# Patient Record
Sex: Female | Born: 1965 | Race: White | Hispanic: No | Marital: Married | Smoking: Current every day smoker
Health system: Southern US, Community
[De-identification: ages and names within clinical notes are randomized; demographics above are authoritative.]

## PROBLEM LIST (undated history)

## (undated) DIAGNOSIS — J439 Emphysema, unspecified: Secondary | ICD-10-CM

## (undated) DIAGNOSIS — M7989 Other specified soft tissue disorders: Secondary | ICD-10-CM

## (undated) DIAGNOSIS — K829 Disease of gallbladder, unspecified: Secondary | ICD-10-CM

## (undated) DIAGNOSIS — M2550 Pain in unspecified joint: Secondary | ICD-10-CM

## (undated) DIAGNOSIS — R0602 Shortness of breath: Secondary | ICD-10-CM

## (undated) DIAGNOSIS — I700 Atherosclerosis of aorta: Secondary | ICD-10-CM

## (undated) DIAGNOSIS — R3915 Urgency of urination: Secondary | ICD-10-CM

## (undated) DIAGNOSIS — F419 Anxiety disorder, unspecified: Secondary | ICD-10-CM

## (undated) DIAGNOSIS — E739 Lactose intolerance, unspecified: Secondary | ICD-10-CM

## (undated) DIAGNOSIS — K529 Noninfective gastroenteritis and colitis, unspecified: Secondary | ICD-10-CM

## (undated) DIAGNOSIS — M1990 Unspecified osteoarthritis, unspecified site: Secondary | ICD-10-CM

## (undated) DIAGNOSIS — G4730 Sleep apnea, unspecified: Secondary | ICD-10-CM

## (undated) DIAGNOSIS — M792 Neuralgia and neuritis, unspecified: Secondary | ICD-10-CM

## (undated) DIAGNOSIS — I10 Essential (primary) hypertension: Secondary | ICD-10-CM

## (undated) DIAGNOSIS — I739 Peripheral vascular disease, unspecified: Secondary | ICD-10-CM

## (undated) DIAGNOSIS — E119 Type 2 diabetes mellitus without complications: Secondary | ICD-10-CM

## (undated) DIAGNOSIS — R001 Bradycardia, unspecified: Secondary | ICD-10-CM

## (undated) DIAGNOSIS — E7800 Pure hypercholesterolemia, unspecified: Secondary | ICD-10-CM

## (undated) DIAGNOSIS — Z9989 Dependence on other enabling machines and devices: Secondary | ICD-10-CM

## (undated) DIAGNOSIS — Z973 Presence of spectacles and contact lenses: Secondary | ICD-10-CM

## (undated) DIAGNOSIS — K219 Gastro-esophageal reflux disease without esophagitis: Secondary | ICD-10-CM

## (undated) DIAGNOSIS — M47816 Spondylosis without myelopathy or radiculopathy, lumbar region: Secondary | ICD-10-CM

## (undated) DIAGNOSIS — J449 Chronic obstructive pulmonary disease, unspecified: Secondary | ICD-10-CM

## (undated) DIAGNOSIS — R350 Frequency of micturition: Secondary | ICD-10-CM

## (undated) DIAGNOSIS — F32A Depression, unspecified: Secondary | ICD-10-CM

## (undated) DIAGNOSIS — R351 Nocturia: Secondary | ICD-10-CM

## (undated) DIAGNOSIS — E039 Hypothyroidism, unspecified: Secondary | ICD-10-CM

## (undated) DIAGNOSIS — G4733 Obstructive sleep apnea (adult) (pediatric): Secondary | ICD-10-CM

## (undated) DIAGNOSIS — M549 Dorsalgia, unspecified: Secondary | ICD-10-CM

## (undated) DIAGNOSIS — E559 Vitamin D deficiency, unspecified: Secondary | ICD-10-CM

## (undated) HISTORY — DX: Vitamin D deficiency, unspecified: E55.9

## (undated) HISTORY — DX: Lactose intolerance, unspecified: E73.9

## (undated) HISTORY — DX: Disease of gallbladder, unspecified: K82.9

## (undated) HISTORY — DX: Dorsalgia, unspecified: M54.9

## (undated) HISTORY — DX: Sleep apnea, unspecified: G47.30

## (undated) HISTORY — DX: Pain in unspecified joint: M25.50

## (undated) HISTORY — DX: Shortness of breath: R06.02

## (undated) HISTORY — DX: Noninfective gastroenteritis and colitis, unspecified: K52.9

## (undated) HISTORY — DX: Anxiety disorder, unspecified: F41.9

## (undated) HISTORY — DX: Pure hypercholesterolemia, unspecified: E78.00

## (undated) HISTORY — DX: Bradycardia, unspecified: R00.1

## (undated) HISTORY — DX: Morbid (severe) obesity due to excess calories: E66.01

## (undated) HISTORY — DX: Peripheral vascular disease, unspecified: I73.9

## (undated) HISTORY — DX: Depression, unspecified: F32.A

## (undated) HISTORY — DX: Atherosclerosis of aorta: I70.0

## (undated) HISTORY — DX: Emphysema, unspecified: J43.9

## (undated) HISTORY — DX: Other specified soft tissue disorders: M79.89

## (undated) HISTORY — PX: COLONOSCOPY: SHX174

## (undated) HISTORY — DX: Unspecified osteoarthritis, unspecified site: M19.90

## (undated) HISTORY — DX: Hypothyroidism, unspecified: E03.9

---

## 1990-05-25 HISTORY — PX: LAPAROSCOPIC CHOLECYSTECTOMY: SUR755

## 1993-05-25 HISTORY — PX: TUBAL LIGATION: SHX77

## 1998-04-16 ENCOUNTER — Encounter: Admission: RE | Admit: 1998-04-16 | Discharge: 1998-04-16 | Payer: Self-pay | Admitting: *Deleted

## 2009-09-04 ENCOUNTER — Emergency Department (HOSPITAL_COMMUNITY): Admission: EM | Admit: 2009-09-04 | Discharge: 2009-09-04 | Payer: Self-pay | Admitting: Emergency Medicine

## 2016-09-29 ENCOUNTER — Encounter: Payer: Self-pay | Admitting: *Deleted

## 2016-09-30 ENCOUNTER — Ambulatory Visit (INDEPENDENT_AMBULATORY_CARE_PROVIDER_SITE_OTHER): Payer: BLUE CROSS/BLUE SHIELD | Admitting: Cardiovascular Disease

## 2016-09-30 ENCOUNTER — Encounter: Payer: Self-pay | Admitting: Cardiovascular Disease

## 2016-09-30 VITALS — BP 122/78 | HR 58 | Ht 66.0 in | Wt 264.0 lb

## 2016-09-30 DIAGNOSIS — I517 Cardiomegaly: Secondary | ICD-10-CM | POA: Diagnosis not present

## 2016-09-30 DIAGNOSIS — Z01818 Encounter for other preprocedural examination: Secondary | ICD-10-CM

## 2016-09-30 NOTE — Patient Instructions (Signed)
Medication Instructions:  Continue all current medications.  Labwork: none  Testing/Procedures:  Your physician has requested that you have an echocardiogram. Echocardiography is a painless test that uses sound waves to create images of your heart. It provides your doctor with information about the size and shape of your heart and how well your heart's chambers and valves are working. This procedure takes approximately one hour. There are no restrictions for this procedure.  Office will contact with results via phone or letter.    Follow-Up: To be determined.    Any Other Special Instructions Will Be Listed Below (If Applicable).  If you need a refill on your cardiac medications before your next appointment, please call your pharmacy.  

## 2016-09-30 NOTE — Progress Notes (Signed)
CARDIOLOGY CONSULT NOTE  Patient ID: Christy Randall MRN: [id number] DOB/AGE: [birth year] 51 y.o.  Admit date: (Not on file) Primary Physician: Monico Blitz, MD Referring Physician: Elsie Saas MD  Reason for Consultation: preop clearance, LVH  HPI: Christy Randall is a 51 y.o. female who is being seen today for the evaluation of preoperative risk stratification and LVH at the request of Elsie Saas, MD. Christy Randall needs left total Randall replacement surgery. She has a history of hypothyroidism. There is some mention in documentation regarding "questionable LVH".  ECG performed in the office today which I ordered and personally interpreted demonstrates sinus bradycardia with mild nonspecific T-wave abnormalities. There is no evidence of LVH by ECG criteria.  I do not find any documentation regarding stress testing or echocardiograms.  It appears about 20 years ago she had been on a diet medication. She went to [facility] and got an echocardiogram on her own which reportedly demonstrated "LVH ". She never saw a cardiologist until today. Her daughter who is an Therapist, sports mentioned this at her appointment with her orthopedic surgeon.  She denies exertional chest pain. She said she is much less short of breath after having quit smoking on 05/24/2016. She smoked from one to one and a half packs of cigarettes daily for 37 years. She denies palpitations. She denies ankle swelling, orthopnea, and paroxysmal nocturnal dyspnea.  She has sleep apnea and started CPAP this week area she said she was supposed to start CPAP sometime back.     Allergies  Allergen Reactions  . Morphine And Related     Current Outpatient Prescriptions  Medication Sig Dispense Refill  . diazepam (VALIUM) 2 MG tablet Take 2 mg by mouth every 6 (six) hours as needed for anxiety.    Marland Kitchen levothyroxine (SYNTHROID, LEVOTHROID) 125 MCG tablet Take 125 mcg by mouth daily before breakfast.    . meloxicam (MOBIC) 7.5 MG  tablet Take 15 mg by mouth daily.     No current facility-administered medications for this visit.     Past Medical History:  Diagnosis Date  . Anxiety   . Hypothyroid   . Osteoarthritis    left Randall  . Sleep apnea     Past Surgical History:  Procedure Laterality Date  . CESAREAN SECTION    . CHOLECYSTECTOMY    . TUBAL LIGATION      Social History   Social History  . Marital status: Divorced    Spouse name: N/A  . Number of children: N/A  . Years of education: N/A   Occupational History  . Not on file.   Social History Main Topics  . Smoking status: Former Smoker    Packs/day: 1.00    Types: Cigarettes    Start date: 11/19/1986    Quit date: 05/24/2016  . Smokeless tobacco: Never Used  . Alcohol use Not on file  . Drug use: Unknown  . Sexual activity: Not on file   Other Topics Concern  . Not on file   Social History Narrative  . No narrative on file     No family history of premature CAD in 1st degree relatives.  Current Meds  Medication Sig  . diazepam (VALIUM) 2 MG tablet Take 2 mg by mouth every 6 (six) hours as needed for anxiety.  Marland Kitchen levothyroxine (SYNTHROID, LEVOTHROID) 125 MCG tablet Take 125 mcg by mouth daily before breakfast.  . meloxicam (MOBIC) 7.5 MG tablet Take 15 mg by mouth daily.  . [  DISCONTINUED] levothyroxine (SYNTHROID, LEVOTHROID) 150 MCG tablet Take 150 mcg by mouth daily before breakfast.      Review of systems complete and found to be negative unless listed above in HPI    Physical exam Blood pressure 122/78, pulse (!) 58, height 5\' 6"  (1.676 m), weight 264 lb (119.7 kg), SpO2 98 %. General: NAD Neck: No JVD, no thyromegaly or thyroid nodule.  Lungs: Clear to auscultation bilaterally with normal respiratory effort. CV: Nondisplaced PMI. Regular rate and rhythm, normal S1/S2, no S3/S4, no murmur.  No peripheral edema.  No carotid bruit.    Abdomen: Soft, nontender, obese. Skin: Intact without lesions or rashes.    Neurologic: Alert and oriented x 3.  Psych: Normal affect. Extremities: No clubbing or cyanosis.  HEENT: Normal.   ECG: Most recent ECG reviewed.   Labs: No results found for: K, BUN, CREATININE, ALT, TSH, HGB   Lipids: No results found for: LDLCALC, LDLDIRECT, CHOL, TRIG, HDL      ASSESSMENT AND PLAN:  1. Preoperative risk stratification: She denies exertional chest pain. Cardiac risk factors include a prior history of tobacco abuse and a current history of sleep apnea. I will order a 2-D echocardiogram with Doppler to evaluate cardiac structure, function, and regional wall motion. Given the absence of symptoms, she is likely at a low perioperative cardiac risk for planned surgery.  2. Left ventricular hypertrophy: No evidence of LVH on ECG. No history of hypertension. She does have a long history of tobacco abuse and has sleep apnea for which she recently started treatment. I will order a 2-D echocardiogram with Doppler to evaluate cardiac structure, function, and regional wall motion.   Disposition: Follow up to be determined.  Signed: Kate Sable, M.D., F.A.C.C.  09/30/2016, 3:01 PM

## 2016-10-01 ENCOUNTER — Ambulatory Visit (HOSPITAL_COMMUNITY)
Admission: RE | Admit: 2016-10-01 | Discharge: 2016-10-01 | Disposition: A | Discharge status: Home / Self Care | Payer: BLUE CROSS/BLUE SHIELD | Source: Ambulatory Visit | Attending: Cardiovascular Disease | Admitting: Cardiovascular Disease

## 2016-10-01 ENCOUNTER — Telehealth: Payer: Self-pay | Admitting: *Deleted

## 2016-10-01 DIAGNOSIS — I517 Cardiomegaly: Secondary | ICD-10-CM | POA: Diagnosis not present

## 2016-10-01 HISTORY — PX: TRANSTHORACIC ECHOCARDIOGRAM: SHX275

## 2016-10-01 NOTE — Progress Notes (Signed)
*  PRELIMINARY RESULTS* Echocardiogram 2D Echocardiogram has been performed.  Christy Randall 10/01/2016, 9:26 AM

## 2016-10-01 NOTE — Telephone Encounter (Signed)
Notes recorded by Laurine Blazer, LPN on 1/83/4373 at 5:78 PM EDT Patient notified. Copy to pmd & to [employer]. Clearance form faxed back today. ------  Notes recorded by Herminio Commons, MD on 10/01/2016 at 3:27 PM EDT Vigorous pumping function.

## 2016-10-20 NOTE — Pre-Procedure Instructions (Signed)
Christy Randall  10/20/2016      [facility] [address], [place] [address] Phone: [phone] Fax: [phone]   Your procedure is scheduled on November 02, 2016. Report to [facility] Admitting at 530 AM.  Call this number if you have problems the morning of surgery:[phone]   Remember:  Do not eat food or drink liquids after midnight.  Take these medicines the morning of surgery with A SIP OF WATER diazepam (valium)-if needed for anxiety, hydrocodone-acetaminophen (norco)-if needed for pain, levothyroxine (synthroid).  7 days prior to surgery STOP taking any Meloxicam (mobic), Aspirin, Aleve, Naproxen, Ibuprofen, Motrin, Advil, Goody's, BC's, all herbal medications, fish oil, and all vitamins   Do not wear jewelry, make-up or nail polish.  Do not wear lotions, powders, or perfumes, or deoderant.  Do not shave 48 hours prior to surgery.    Do not bring valuables to the hospital.  [facility] is not responsible for any belongings or valuables.  Contacts, dentures or bridgework may not be worn into surgery.  Leave your suitcase in the car.  After surgery it may be brought to your room.  For patients admitted to the hospital, discharge time will be determined by your treatment team.  Patients discharged the day of surgery will not be allowed to drive home.    Special instructions:   [facility]- Preparing For Surgery  Before surgery, you can play an important role. Because skin is not sterile, your skin needs to be as free of germs as possible. You can reduce the number of germs on your skin by washing with CHG (chlorahexidine gluconate) Soap before surgery.  CHG is an antiseptic cleaner which kills germs and bonds with the skin to continue killing germs even after washing.  Please do not use if you have an allergy to CHG or antibacterial soaps. If your skin becomes reddened/irritated stop using the CHG.  Do not shave  (including legs and underarms) for at least 48 hours prior to first CHG shower. It is OK to shave your face.  Please follow these instructions carefully.   1. Shower the NIGHT BEFORE SURGERY and the MORNING OF SURGERY with CHG.   2. If you chose to wash your hair, wash your hair first as usual with your normal shampoo.  3. After you shampoo, rinse your hair and body thoroughly to remove the shampoo.  4. Use CHG as you would any other liquid soap. You can apply CHG directly to the skin and wash gently with a scrungie or a clean washcloth.   5. Apply the CHG Soap to your body ONLY FROM THE NECK DOWN.  Do not use on open wounds or open sores. Avoid contact with your eyes, ears, mouth and genitals (private parts). Wash genitals (private parts) with your normal soap.  6. Wash thoroughly, paying special attention to the area where your surgery will be performed.  7. Thoroughly rinse your body with warm water from the neck down.  8. DO NOT shower/wash with your normal soap after using and rinsing off the CHG Soap.  9. Pat yourself dry with a CLEAN TOWEL.   10. Wear CLEAN PAJAMAS   11. Place CLEAN SHEETS on your bed the night of your first shower and DO NOT SLEEP WITH PETS.    Day of Surgery: Do not apply any deodorants/lotions. Please wear clean clothes to the hospital/surgery center.  Please read over the following fact sheets that you were given. Pain Booklet, Coughing and Deep Breathing, MRSA Information and Surgical Site Infection Prevention

## 2016-10-21 ENCOUNTER — Encounter (HOSPITAL_COMMUNITY)
Admission: RE | Admit: 2016-10-21 | Discharge: 2016-10-21 | Disposition: A | Discharge status: Home / Self Care | Payer: BLUE CROSS/BLUE SHIELD | Source: Ambulatory Visit | Attending: Orthopedic Surgery | Admitting: Orthopedic Surgery

## 2016-10-21 ENCOUNTER — Encounter (HOSPITAL_COMMUNITY): Payer: Self-pay | Admitting: Physician Assistant

## 2016-10-21 ENCOUNTER — Encounter (HOSPITAL_COMMUNITY): Payer: Self-pay | Admitting: *Deleted

## 2016-10-21 DIAGNOSIS — Z01818 Encounter for other preprocedural examination: Secondary | ICD-10-CM | POA: Diagnosis present

## 2016-10-21 DIAGNOSIS — E039 Hypothyroidism, unspecified: Secondary | ICD-10-CM | POA: Diagnosis present

## 2016-10-21 DIAGNOSIS — M1712 Unilateral primary osteoarthritis, left knee: Secondary | ICD-10-CM | POA: Insufficient documentation

## 2016-10-21 DIAGNOSIS — G4730 Sleep apnea, unspecified: Secondary | ICD-10-CM | POA: Diagnosis present

## 2016-10-21 DIAGNOSIS — J449 Chronic obstructive pulmonary disease, unspecified: Secondary | ICD-10-CM | POA: Diagnosis present

## 2016-10-21 DIAGNOSIS — K219 Gastro-esophageal reflux disease without esophagitis: Secondary | ICD-10-CM | POA: Diagnosis present

## 2016-10-21 DIAGNOSIS — F419 Anxiety disorder, unspecified: Secondary | ICD-10-CM | POA: Diagnosis present

## 2016-10-21 HISTORY — DX: Chronic obstructive pulmonary disease, unspecified: J44.9

## 2016-10-21 HISTORY — DX: Gastro-esophageal reflux disease without esophagitis: K21.9

## 2016-10-21 LAB — BASIC METABOLIC PANEL
Anion gap: 10 (ref 5–15)
BUN: 12 mg/dL (ref 6–20)
CALCIUM: 9 mg/dL (ref 8.9–10.3)
CO2: 26 mmol/L (ref 22–32)
Chloride: 103 mmol/L (ref 101–111)
Creatinine, Ser: 0.92 mg/dL (ref 0.44–1.00)
GFR calc Af Amer: >60 mL/min (ref >60)
GLUCOSE: 180 mg/dL — AB (ref 65–99)
POTASSIUM: 3.6 mmol/L (ref 3.5–5.1)
Sodium: 139 mmol/L (ref 135–145)

## 2016-10-21 LAB — CBC
HEMATOCRIT: 42.2 % (ref 36.0–46.0)
HEMOGLOBIN: 13.8 g/dL (ref 12.0–15.0)
MCH: 31.1 pg (ref 26.0–34.0)
MCHC: 32.7 g/dL (ref 30.0–36.0)
MCV: 95 fL (ref 78.0–100.0)
Platelets: 269 10*3/uL (ref 150–400)
RBC: 4.44 MIL/uL (ref 3.87–5.11)
RDW: 14.1 % (ref 11.5–15.5)
WBC: 10.2 10*3/uL (ref 4.0–10.5)

## 2016-10-21 LAB — SURGICAL PCR SCREEN
MRSA, PCR: NEGATIVE
STAPHYLOCOCCUS AUREUS: POSITIVE — AB

## 2016-10-21 NOTE — Progress Notes (Signed)
Prescription called in at [facility] in [place], patient verbalized understanding

## 2016-10-21 NOTE — Progress Notes (Signed)
PCP - Monico Blitz Cardiologist - denies  Chest x-ray - not needed EKG - 09/30/16 Stress Test - denies ECHO - 10/01/16 Cardiac Cath - denies  Sleep Study - home sleep study a "few Years ago" CPAP - wears at night - instructed patient to bring mask and tubing with her the morning of surgery    Sending to anesthesia for review of sleep apnea   Patient denies shortness of breath, fever, cough and chest pain at PAT appointment   Patient verbalized understanding of instructions that were given to them at the PAT appointment. Patient was also instructed that they will need to review over the PAT instructions again at home before surgery.

## 2016-10-21 NOTE — H&P (Signed)
TOTAL KNEE ADMISSION H&P  Patient is being admitted for left total knee arthroplasty.  Subjective:  Chief Complaint:left knee pain.  HPI: Christy Randall, 51 y.o. female, has a history of pain and functional disability in the left knee due to arthritis and has failed non-surgical conservative treatments for greater than 12 weeks to includeNSAID's and/or analgesics, corticosteriod injections, viscosupplementation injections, flexibility and strengthening excercises, supervised PT with diminished ADL's post treatment, weight reduction as appropriate and activity modification.  Onset of symptoms was gradual, starting 10 years ago with gradually worsening course since that time. The patient noted no past surgery on the left knee(s).  Patient currently rates pain in the left knee(s) at 10 out of 10 with activity. Patient has night pain, worsening of pain with activity and weight bearing, pain that interferes with activities of daily living, crepitus and joint swelling.  Patient has evidence of subchondral sclerosis, periarticular osteophytes and joint space narrowing by imaging studies.There is no active infection.  Patient Active Problem List   Diagnosis Date Noted  . Primary localized osteoarthrosis of the knee, left   . Sleep apnea uses CPAP   . Hypothyroid   . GERD (gastroesophageal reflux disease)   . COPD (chronic obstructive pulmonary disease) ([place])   . Anxiety    Past Medical History:  Diagnosis Date  . Anxiety   . COPD (chronic obstructive pulmonary disease) ([place])   . GERD (gastroesophageal reflux disease)   . Hypothyroid   . Osteoarthritis    left knee  . Primary localized osteoarthrosis of the knee, left   . Sleep apnea uses CPAP     Past Surgical History:  Procedure Laterality Date  . CESAREAN SECTION    . CHOLECYSTECTOMY    . COLONOSCOPY    . TUBAL LIGATION      No current facility-administered medications for this encounter.   Current Outpatient Prescriptions:  .   Cholecalciferol (VITAMIN D3) 2000 units TABS, Take 2,000 Units by mouth daily., Disp: , Rfl:  .  diazepam (VALIUM) 2 MG tablet, Take 2 mg by mouth every 6 (six) hours as needed for anxiety., Disp: , Rfl:  .  HYDROcodone-acetaminophen (NORCO/VICODIN) 5-325 MG tablet, Take 1 tablet by mouth every 8 (eight) hours as needed (for pain.). (TYPICALLY AT BEDTIME ONLY), Disp: , Rfl:  .  levothyroxine (SYNTHROID, LEVOTHROID) 125 MCG tablet, Take 125 mcg by mouth daily before breakfast., Disp: , Rfl:  .  predniSONE (DELTASONE) 5 MG tablet, Take 5 mg by mouth daily with breakfast., Disp: , Rfl:  .  meloxicam (MOBIC) 7.5 MG tablet, Take 15 mg by mouth daily., Disp: , Rfl:  Allergies  Allergen Reactions  . Morphine And Related Other (See Comments)    "Felt like she was going to die & felt like her blood was on fire"    Social History  Substance Use Topics  . Smoking status: Former Smoker    Packs/day: 1.00    Years: 37.00    Types: Cigarettes    Start date: 11/19/1986    Quit date: 05/24/2016  . Smokeless tobacco: Never Used  . Alcohol use No    Family History  Problem Relation Age of Onset  . Diabetes Mother      Review of Systems  Constitutional: Negative.   HENT: Negative.   Eyes: Negative.   Respiratory: Negative.   Cardiovascular: Negative.   Gastrointestinal: Negative.   Genitourinary: Negative.   Musculoskeletal: Positive for back pain and joint pain.  Skin: Negative.   Neurological:  Negative.   Endo/Heme/Allergies: Negative.   Psychiatric/Behavioral: Negative.     Objective:  Physical Exam  Constitutional: She is oriented to person, place, and time. She appears well-developed and well-nourished.  HENT:  Head: Normocephalic and atraumatic.  Mouth/Throat: Oropharynx is clear and moist.  Eyes: Conjunctivae are normal. Pupils are equal, round, and reactive to light.  Neck: Neck supple.  Cardiovascular: Normal rate and regular rhythm.   Respiratory: Effort normal and breath  sounds normal.  GI: Soft. Bowel sounds are normal.  Genitourinary:  Genitourinary Comments: Not pertinent to current symptomatology therefore not examined.  Musculoskeletal:  Examination of her left knee reveals pain medially and laterally.  1+ crepitation.  1+ synovitis.  Range of motion is from 0-120 degrees.  Knee is stable with normal patella tracking.  Examination of her right knee reveals full range of motion without pain, swelling, weakness or instability.  Vascular exam: Pulses are 2+ and symmetric.  Neurologic exam: Distal motor and sensory examination is within normal limits.  Neurological: She is alert and oriented to person, place, and time.  Skin: Skin is warm and dry.  Psychiatric: She has a normal mood and affect. Her behavior is normal.    Vital signs in last 24 hours: Temp:  [97.9 F (36.6 C)-98.2 F (36.8 C)] 97.9 F (36.6 C) (05/30 1400) Pulse Rate:  [59-68] 68 (05/30 1400) Resp:  [20] 20 (05/30 0911) BP: (132-154)/(70-80) 132/70 (05/30 1400) SpO2:  [96 %-98 %] 98 % (05/30 1400) Weight:  [122.5 kg (270 lb)-122.7 kg (270 lb 9.6 oz)] 122.5 kg (270 lb) (05/30 1400)  Labs:   Estimated body mass index is 44.93 kg/m as calculated from the following:   Height as of this encounter: 5\' 5"  (1.651 m).   Weight as of this encounter: 122.5 kg (270 lb).   Imaging Review Plain radiographs demonstrate severe degenerative joint disease of the left knee(s). The overall alignment issignificant varus. The bone quality appears to be good for age and reported activity level.  Assessment/Plan:  End stage arthritis, left knee Active Problems:   Primary localized osteoarthrosis of the knee, left   Sleep apnea uses CPAP   Hypothyroid   GERD (gastroesophageal reflux disease)   COPD (chronic obstructive pulmonary disease) (H[facility])   Anxiety    The patient history, physical examination, clinical judgment of the provider and imaging studies are consistent with end stage degenerative  joint disease of the left knee(s) and total knee arthroplasty is deemed medically necessary. The treatment options including medical management, injection therapy arthroscopy and arthroplasty were discussed at length. The risks and benefits of total knee arthroplasty were presented and reviewed. The risks due to aseptic loosening, infection, stiffness, patella tracking problems, thromboembolic complications and other imponderables were discussed. The patient acknowledged the explanation, agreed to proceed with the plan and consent was signed. Patient is being admitted for inpatient treatment for surgery, pain control, PT, OT, prophylactic antibiotics, VTE prophylaxis, progressive ambulation and ADL's and discharge planning. The patient is planning to be discharged home with home health services with husband and Daughter MRSA positive

## 2016-10-22 NOTE — Progress Notes (Signed)
Anesthesia Chart Review:  Pt is a 51 year old female scheduled for L total knee arthroplasty on 11/02/2016 with Elsie Saas, M.D.  - PCP is Monico Blitz, MD - Saw cardiologist Kate Sable, MD for pre-op eval 09/30/16.  Echo ordered, results below. Pt cleared for surgery.   PMH includes: COPD, OSA, hypothyroidism, GERD. Former smoker. BMI 45.  Medications include: Levothyroxine, prednisone  Preoperative labs reviewed.  - Glucose 180. Pt does not have history of DM. Will route results to PCP for follow-up.   EKG 09/30/16: Sinus  Bradycardia (56 bpm). RSR(V1) -nondiagnostic. Nonspecific T-abnormality.   Echo 10/01/16:  - Left ventricle: The cavity size was normal. Wall thickness was increased in a pattern of mild LVH. Systolic function was vigorous. The estimated ejection fraction was in the range of 65% to 70%. Wall motion was normal; there were no regional wall motion abnormalities. Doppler parameters are consistent with abnormal left ventricular relaxation (grade 1 diastolic dysfunction). - Mitral valve: There was trivial regurgitation. - Right atrium: Central venous pressure (est): 3 mm Hg. - Tricuspid valve: There was trivial regurgitation. - Pulmonary arteries: Systolic pressure could not be accurately estimated. - Pericardium, extracardiac: A prominent pericardial fat pad was present. - Impressions: Mild LVH with LVEF 65-70% and grade 1 diastolic dysfunction. Trivial mitral regurgitation. Trivial tricuspid regurgitation.  If no changes, I anticipate pt can proceed with surgery as scheduled.   Willeen Cass, FNP-BC [facility]/Anesthesiology Phone: [phone] 10/22/2016 1:18 PM

## 2016-10-23 ENCOUNTER — Other Ambulatory Visit (HOSPITAL_COMMUNITY): Payer: BLUE CROSS/BLUE SHIELD

## 2016-10-30 MED ORDER — TRANEXAMIC ACID 1000 MG/10ML IV SOLN
1000.0000 mg | INTRAVENOUS | Status: AC
  Administered 2016-11-02: 1000 mg via INTRAVENOUS
  Filled 2016-10-30: qty 10

## 2016-10-30 MED ORDER — VANCOMYCIN HCL 10 G IV SOLR
1500.0000 mg | INTRAVENOUS | Status: AC
  Administered 2016-11-02: 1500 mg via INTRAVENOUS
  Filled 2016-10-30 (×2): qty 1500

## 2016-10-30 MED ORDER — LACTATED RINGERS IV SOLN
INTRAVENOUS | Status: DC
Start: 2016-11-02 — End: 2016-11-02

## 2016-11-01 NOTE — Anesthesia Preprocedure Evaluation (Addendum)
Anesthesia Evaluation  Patient identified by MRN, date of birth, ID band Patient awake    Reviewed: Allergy & Precautions, H&P , NPO status , Patient's Chart, lab work & pertinent test results  Airway Mallampati: III  TM Distance: >3 FB Neck ROM: Full    Dental no notable dental hx. (+) Teeth Intact, Dental Advisory Given   Pulmonary sleep apnea and Continuous Positive Airway Pressure Ventilation , former smoker,    Pulmonary exam normal breath sounds clear to auscultation       Cardiovascular Exercise Tolerance: Good negative cardio ROS   Rhythm:Regular Rate:Normal     Neuro/Psych Anxiety negative neurological ROS  negative psych ROS   GI/Hepatic Neg liver ROS, GERD  Controlled,  Endo/Other  Hypothyroidism Morbid obesity  Renal/GU negative Renal ROS  negative genitourinary   Musculoskeletal  (+) Arthritis , Osteoarthritis,    Abdominal   Peds  Hematology negative hematology ROS (+)   Anesthesia Other Findings   Reproductive/Obstetrics negative OB ROS                            Anesthesia Physical Anesthesia Plan  ASA: III  Anesthesia Plan: Spinal   Post-op Pain Management:  Regional for Post-op pain   Induction: Intravenous  PONV Risk Score and Plan: 3 and Ondansetron, Dexamethasone, Propofol and Midazolam  Airway Management Planned: Simple Face Mask  Additional Equipment:   Intra-op Plan:   Post-operative Plan:   Informed Consent: I have reviewed the patients History and Physical, chart, labs and discussed the procedure including the risks, benefits and alternatives for the proposed anesthesia with the patient or authorized representative who has indicated his/her understanding and acceptance.   Dental advisory given  Plan Discussed with: CRNA  Anesthesia Plan Comments:        Anesthesia Quick Evaluation

## 2016-11-02 ENCOUNTER — Inpatient Hospital Stay (HOSPITAL_COMMUNITY): Payer: BLUE CROSS/BLUE SHIELD | Admitting: Emergency Medicine

## 2016-11-02 ENCOUNTER — Encounter (HOSPITAL_COMMUNITY)
Admission: RE | Disposition: A | Discharge status: Home Health | Payer: Self-pay | Source: Ambulatory Visit | Attending: Orthopedic Surgery

## 2016-11-02 ENCOUNTER — Inpatient Hospital Stay (HOSPITAL_COMMUNITY): Payer: BLUE CROSS/BLUE SHIELD | Admitting: Anesthesiology

## 2016-11-02 ENCOUNTER — Encounter (HOSPITAL_COMMUNITY): Payer: Self-pay | Admitting: Anesthesiology

## 2016-11-02 ENCOUNTER — Inpatient Hospital Stay (HOSPITAL_COMMUNITY)
Admission: RE | Admit: 2016-11-02 | Discharge: 2016-11-04 | DRG: 470 | Disposition: A | Discharge status: Home Health | Payer: BLUE CROSS/BLUE SHIELD | Source: Ambulatory Visit | Attending: Orthopedic Surgery | Admitting: Orthopedic Surgery

## 2016-11-02 DIAGNOSIS — Z23 Encounter for immunization: Secondary | ICD-10-CM | POA: Diagnosis not present

## 2016-11-02 DIAGNOSIS — Z6841 Body Mass Index (BMI) 40.0 and over, adult: Secondary | ICD-10-CM

## 2016-11-02 DIAGNOSIS — Z833 Family history of diabetes mellitus: Secondary | ICD-10-CM

## 2016-11-02 DIAGNOSIS — M79605 Pain in left leg: Secondary | ICD-10-CM

## 2016-11-02 DIAGNOSIS — M545 Low back pain, unspecified: Secondary | ICD-10-CM | POA: Diagnosis present

## 2016-11-02 DIAGNOSIS — Z87891 Personal history of nicotine dependence: Secondary | ICD-10-CM

## 2016-11-02 DIAGNOSIS — Z79899 Other long term (current) drug therapy: Secondary | ICD-10-CM | POA: Diagnosis not present

## 2016-11-02 DIAGNOSIS — E039 Hypothyroidism, unspecified: Secondary | ICD-10-CM | POA: Diagnosis present

## 2016-11-02 DIAGNOSIS — G4730 Sleep apnea, unspecified: Secondary | ICD-10-CM | POA: Diagnosis present

## 2016-11-02 DIAGNOSIS — K219 Gastro-esophageal reflux disease without esophagitis: Secondary | ICD-10-CM | POA: Diagnosis present

## 2016-11-02 DIAGNOSIS — Z791 Long term (current) use of non-steroidal anti-inflammatories (NSAID): Secondary | ICD-10-CM | POA: Diagnosis not present

## 2016-11-02 DIAGNOSIS — J449 Chronic obstructive pulmonary disease, unspecified: Secondary | ICD-10-CM | POA: Diagnosis present

## 2016-11-02 DIAGNOSIS — Z7952 Long term (current) use of systemic steroids: Secondary | ICD-10-CM | POA: Diagnosis not present

## 2016-11-02 DIAGNOSIS — F419 Anxiety disorder, unspecified: Secondary | ICD-10-CM | POA: Diagnosis present

## 2016-11-02 DIAGNOSIS — M1712 Unilateral primary osteoarthritis, left knee: Secondary | ICD-10-CM | POA: Diagnosis present

## 2016-11-02 DIAGNOSIS — R0989 Other specified symptoms and signs involving the circulatory and respiratory systems: Secondary | ICD-10-CM

## 2016-11-02 HISTORY — PX: TOTAL KNEE ARTHROPLASTY: SHX125

## 2016-11-02 LAB — ABO/RH: ABO/RH(D): A NEG

## 2016-11-02 LAB — TYPE AND SCREEN
ABO/RH(D): A NEG
Antibody Screen: NEGATIVE

## 2016-11-02 SURGERY — ARTHROPLASTY, KNEE, TOTAL
Anesthesia: Spinal | Site: Knee | Laterality: Left

## 2016-11-02 MED ORDER — OXYCODONE HCL 5 MG PO TABS
ORAL_TABLET | ORAL | Status: AC
  Filled 2016-11-02: qty 1

## 2016-11-02 MED ORDER — LIDOCAINE 2% (20 MG/ML) 5 ML SYRINGE
INTRAMUSCULAR | Status: AC
  Filled 2016-11-02: qty 5

## 2016-11-02 MED ORDER — VANCOMYCIN HCL IN DEXTROSE 1-5 GM/200ML-% IV SOLN
1000.0000 mg | Freq: Two times a day (BID) | INTRAVENOUS | Status: AC
  Administered 2016-11-02: 1000 mg via INTRAVENOUS
  Filled 2016-11-02: qty 200

## 2016-11-02 MED ORDER — METOCLOPRAMIDE HCL 5 MG/ML IJ SOLN: 5.0000 mg | Freq: Three times a day (TID) | INTRAMUSCULAR | Status: DC | PRN

## 2016-11-02 MED ORDER — DIPHENHYDRAMINE HCL 12.5 MG/5ML PO ELIX: 12.5000 mg | ORAL_SOLUTION | ORAL | Status: DC | PRN

## 2016-11-02 MED ORDER — DEXAMETHASONE SODIUM PHOSPHATE 10 MG/ML IJ SOLN
10.0000 mg | Freq: Three times a day (TID) | INTRAMUSCULAR | Status: AC
  Administered 2016-11-02 – 2016-11-03 (×4): 10 mg via INTRAVENOUS
  Filled 2016-11-02 (×4): qty 1

## 2016-11-02 MED ORDER — ONDANSETRON HCL 4 MG/2ML IJ SOLN
INTRAMUSCULAR | Status: DC | PRN
  Administered 2016-11-02: 4 mg via INTRAVENOUS

## 2016-11-02 MED ORDER — DEXTROSE 5 % IV SOLN
INTRAVENOUS | Status: DC | PRN
  Administered 2016-11-02: 25 ug/min via INTRAVENOUS

## 2016-11-02 MED ORDER — PROPOFOL 500 MG/50ML IV EMUL
INTRAVENOUS | Status: DC | PRN
  Administered 2016-11-02: 75 ug/kg/min via INTRAVENOUS

## 2016-11-02 MED ORDER — OXYCODONE HCL 5 MG PO TABS
5.0000 mg | ORAL_TABLET | ORAL | Status: DC | PRN
  Administered 2016-11-02 (×3): 10 mg via ORAL
  Administered 2016-11-02: 5 mg via ORAL
  Administered 2016-11-03 – 2016-11-04 (×9): 10 mg via ORAL
  Filled 2016-11-02 (×12): qty 2

## 2016-11-02 MED ORDER — PROPOFOL 1000 MG/100ML IV EMUL
INTRAVENOUS | Status: AC
  Filled 2016-11-02: qty 100

## 2016-11-02 MED ORDER — CHLORHEXIDINE GLUCONATE 4 % EX LIQD: 60.0000 mL | Freq: Once | CUTANEOUS | Status: DC

## 2016-11-02 MED ORDER — ONDANSETRON HCL 4 MG/2ML IJ SOLN
4.0000 mg | Freq: Four times a day (QID) | INTRAMUSCULAR | Status: DC | PRN
  Administered 2016-11-02: 4 mg via INTRAVENOUS
  Filled 2016-11-02: qty 2

## 2016-11-02 MED ORDER — BUPIVACAINE IN DEXTROSE 0.75-8.25 % IT SOLN
INTRATHECAL | Status: DC | PRN
  Administered 2016-11-02: 15 mg via INTRATHECAL

## 2016-11-02 MED ORDER — DIAZEPAM 2 MG PO TABS
2.0000 mg | ORAL_TABLET | Freq: Four times a day (QID) | ORAL | Status: DC | PRN
  Administered 2016-11-02 – 2016-11-04 (×4): 2 mg via ORAL
  Filled 2016-11-02 (×4): qty 1

## 2016-11-02 MED ORDER — PREDNISONE 5 MG PO TABS: 5.0000 mg | ORAL_TABLET | Freq: Every day | ORAL | Status: DC

## 2016-11-02 MED ORDER — ASPIRIN EC 325 MG PO TBEC
325.0000 mg | DELAYED_RELEASE_TABLET | Freq: Every day | ORAL | Status: DC
  Administered 2016-11-03 – 2016-11-04 (×2): 325 mg via ORAL
  Filled 2016-11-02 (×2): qty 1

## 2016-11-02 MED ORDER — ACETAMINOPHEN 650 MG RE SUPP: 650.0000 mg | Freq: Four times a day (QID) | RECTAL | Status: DC | PRN

## 2016-11-02 MED ORDER — FENTANYL CITRATE (PF) 250 MCG/5ML IJ SOLN
INTRAMUSCULAR | Status: AC
  Filled 2016-11-02: qty 5

## 2016-11-02 MED ORDER — ACETAMINOPHEN 325 MG PO TABS
650.0000 mg | ORAL_TABLET | Freq: Four times a day (QID) | ORAL | Status: DC | PRN
  Administered 2016-11-03 – 2016-11-04 (×2): 650 mg via ORAL
  Filled 2016-11-02 (×2): qty 2

## 2016-11-02 MED ORDER — HYDROMORPHONE HCL 1 MG/ML IJ SOLN
INTRAMUSCULAR | Status: AC
  Filled 2016-11-02: qty 0.5

## 2016-11-02 MED ORDER — ALUM & MAG HYDROXIDE-SIMETH 200-200-20 MG/5ML PO SUSP: 30.0000 mL | ORAL | Status: DC | PRN

## 2016-11-02 MED ORDER — VITAMIN D 1000 UNITS PO TABS
2000.0000 [IU] | ORAL_TABLET | Freq: Every day | ORAL | Status: DC
  Administered 2016-11-03 – 2016-11-04 (×2): 2000 [IU] via ORAL
  Filled 2016-11-02 (×2): qty 2

## 2016-11-02 MED ORDER — ROPIVACAINE HCL 5 MG/ML IJ SOLN
INTRAMUSCULAR | Status: DC | PRN
  Administered 2016-11-02: 25 mL via PERINEURAL

## 2016-11-02 MED ORDER — DOCUSATE SODIUM 100 MG PO CAPS
100.0000 mg | ORAL_CAPSULE | Freq: Two times a day (BID) | ORAL | Status: DC
  Administered 2016-11-02 – 2016-11-04 (×4): 100 mg via ORAL
  Filled 2016-11-02 (×4): qty 1

## 2016-11-02 MED ORDER — PROPOFOL 10 MG/ML IV BOLUS
INTRAVENOUS | Status: DC | PRN
  Administered 2016-11-02: 20 mg via INTRAVENOUS

## 2016-11-02 MED ORDER — PROPOFOL 10 MG/ML IV BOLUS
INTRAVENOUS | Status: AC
  Filled 2016-11-02: qty 20

## 2016-11-02 MED ORDER — POVIDONE-IODINE 7.5 % EX SOLN: Freq: Once | CUTANEOUS | Status: DC

## 2016-11-02 MED ORDER — FENTANYL CITRATE (PF) 100 MCG/2ML IJ SOLN
INTRAMUSCULAR | Status: DC | PRN
  Administered 2016-11-02 (×2): 50 ug via INTRAVENOUS

## 2016-11-02 MED ORDER — TOBRAMYCIN SULFATE 1.2 G IJ SOLR
INTRAMUSCULAR | Status: DC | PRN
  Administered 2016-11-02: 1.2 g

## 2016-11-02 MED ORDER — TOBRAMYCIN SULFATE 1.2 G IJ SOLR
INTRAMUSCULAR | Status: AC
  Filled 2016-11-02: qty 1.2

## 2016-11-02 MED ORDER — LACTATED RINGERS IV SOLN
INTRAVENOUS | Status: DC | PRN
  Administered 2016-11-02 (×2): via INTRAVENOUS

## 2016-11-02 MED ORDER — HYDROMORPHONE HCL 1 MG/ML IJ SOLN
0.2500 mg | INTRAMUSCULAR | Status: DC | PRN
Start: 2016-11-02 — End: 2016-11-02
  Administered 2016-11-02: 0.5 mg via INTRAVENOUS
  Administered 2016-11-02 (×2): 0.25 mg via INTRAVENOUS

## 2016-11-02 MED ORDER — ACETAMINOPHEN 500 MG PO TABS
1000.0000 mg | ORAL_TABLET | Freq: Four times a day (QID) | ORAL | Status: AC
  Administered 2016-11-02 – 2016-11-03 (×4): 1000 mg via ORAL
  Filled 2016-11-02 (×4): qty 2

## 2016-11-02 MED ORDER — SODIUM CHLORIDE 0.9 % IR SOLN
Status: DC | PRN
  Administered 2016-11-02: 4000 mL

## 2016-11-02 MED ORDER — EPHEDRINE SULFATE 50 MG/ML IJ SOLN
INTRAMUSCULAR | Status: DC | PRN
  Administered 2016-11-02: 10 mg via INTRAVENOUS

## 2016-11-02 MED ORDER — METOCLOPRAMIDE HCL 5 MG PO TABS: 5.0000 mg | ORAL_TABLET | Freq: Three times a day (TID) | ORAL | Status: DC | PRN

## 2016-11-02 MED ORDER — MIDAZOLAM HCL 2 MG/2ML IJ SOLN
INTRAMUSCULAR | Status: AC
  Filled 2016-11-02: qty 2

## 2016-11-02 MED ORDER — BUPIVACAINE-EPINEPHRINE (PF) 0.25% -1:200000 IJ SOLN
INTRAMUSCULAR | Status: AC
  Filled 2016-11-02: qty 30

## 2016-11-02 MED ORDER — ONDANSETRON HCL 4 MG PO TABS: 4.0000 mg | ORAL_TABLET | Freq: Four times a day (QID) | ORAL | Status: DC | PRN

## 2016-11-02 MED ORDER — MIDAZOLAM HCL 5 MG/5ML IJ SOLN
INTRAMUSCULAR | Status: DC | PRN
  Administered 2016-11-02: 2 mg via INTRAVENOUS

## 2016-11-02 MED ORDER — MENTHOL 3 MG MT LOZG: 1.0000 | LOZENGE | OROMUCOSAL | Status: DC | PRN

## 2016-11-02 MED ORDER — POTASSIUM CHLORIDE IN NACL 20-0.9 MEQ/L-% IV SOLN
INTRAVENOUS | Status: DC
  Administered 2016-11-02: 19:00:00 via INTRAVENOUS
  Filled 2016-11-02 (×2): qty 1000

## 2016-11-02 MED ORDER — POLYETHYLENE GLYCOL 3350 17 G PO PACK
17.0000 g | PACK | Freq: Two times a day (BID) | ORAL | Status: DC
  Administered 2016-11-02 – 2016-11-04 (×4): 17 g via ORAL
  Filled 2016-11-02 (×4): qty 1

## 2016-11-02 MED ORDER — BUPIVACAINE-EPINEPHRINE 0.25% -1:200000 IJ SOLN
INTRAMUSCULAR | Status: DC | PRN
  Administered 2016-11-02: 30 mL

## 2016-11-02 MED ORDER — CHLORHEXIDINE GLUCONATE 4 % EX LIQD
60.0000 mL | Freq: Once | CUTANEOUS | Status: DC
Start: 2016-11-02 — End: 2016-11-02

## 2016-11-02 MED ORDER — HYDROMORPHONE HCL 1 MG/ML IJ SOLN
1.0000 mg | INTRAMUSCULAR | Status: DC | PRN
  Administered 2016-11-02 – 2016-11-04 (×12): 1 mg via INTRAVENOUS
  Filled 2016-11-02 (×13): qty 1

## 2016-11-02 MED ORDER — PNEUMOCOCCAL VAC POLYVALENT 25 MCG/0.5ML IJ INJ
0.5000 mL | INJECTION | INTRAMUSCULAR | Status: AC
  Administered 2016-11-03: 0.5 mL via INTRAMUSCULAR
  Filled 2016-11-02: qty 0.5

## 2016-11-02 MED ORDER — PHENOL 1.4 % MT LIQD: 1.0000 | OROMUCOSAL | Status: DC | PRN

## 2016-11-02 MED ORDER — DEXAMETHASONE SODIUM PHOSPHATE 10 MG/ML IJ SOLN
INTRAMUSCULAR | Status: DC | PRN
  Administered 2016-11-02: 10 mg via INTRAVENOUS

## 2016-11-02 MED ORDER — LEVOTHYROXINE SODIUM 25 MCG PO TABS
125.0000 ug | ORAL_TABLET | Freq: Every day | ORAL | Status: DC
  Administered 2016-11-03 – 2016-11-04 (×2): 125 ug via ORAL
  Filled 2016-11-02 (×2): qty 1

## 2016-11-02 MED ORDER — LIDOCAINE HCL (CARDIAC) 20 MG/ML IV SOLN
INTRAVENOUS | Status: DC | PRN
  Administered 2016-11-02: 40 mg via INTRAVENOUS

## 2016-11-02 SURGICAL SUPPLY — 67 items
BANDAGE ESMARK 6X9 LF (GAUZE/BANDAGES/DRESSINGS) ×1 IMPLANT
BENZOIN TINCTURE PRP APPL 2/3 (GAUZE/BANDAGES/DRESSINGS) ×3 IMPLANT
BLADE SAGITTAL 25.0X1.19X90 (BLADE) ×2 IMPLANT
BLADE SAGITTAL 25.0X1.19X90MM (BLADE) ×1
BLADE SAW SGTL 13X75X1.27 (BLADE) ×3 IMPLANT
BLADE SURG 10 STRL SS (BLADE) ×6 IMPLANT
BNDG ELASTIC 6X15 VLCR STRL LF (GAUZE/BANDAGES/DRESSINGS) ×3 IMPLANT
BNDG ESMARK 6X9 LF (GAUZE/BANDAGES/DRESSINGS) ×3
BOWL SMART MIX CTS (DISPOSABLE) ×3 IMPLANT
CAPT KNEE TOTAL 3 ATTUNE ×3 IMPLANT
CEMENT HV SMART SET (Cement) ×6 IMPLANT
CLOSURE WOUND 1/2 X4 (GAUZE/BANDAGES/DRESSINGS) ×1
COVER SURGICAL LIGHT HANDLE (MISCELLANEOUS) ×3 IMPLANT
CUFF TOURNIQUET SINGLE 34IN LL (TOURNIQUET CUFF) ×3 IMPLANT
CUFF TOURNIQUET SINGLE 44IN (TOURNIQUET CUFF) IMPLANT
DECANTER SPIKE VIAL GLASS SM (MISCELLANEOUS) ×3 IMPLANT
DRAPE EXTREMITY T 121X128X90 (DRAPE) ×3 IMPLANT
DRAPE HALF SHEET 40X57 (DRAPES) ×6 IMPLANT
DRAPE INCISE IOBAN 66X45 STRL (DRAPES) IMPLANT
DRAPE U-SHAPE 47X51 STRL (DRAPES) ×3 IMPLANT
DRSG AQUACEL AG ADV 3.5X14 (GAUZE/BANDAGES/DRESSINGS) ×3 IMPLANT
DURAPREP 26ML APPLICATOR (WOUND CARE) ×6 IMPLANT
ELECT CAUTERY BLADE 6.4 (BLADE) ×3 IMPLANT
ELECT REM PT RETURN 9FT ADLT (ELECTROSURGICAL) ×3
ELECTRODE REM PT RTRN 9FT ADLT (ELECTROSURGICAL) ×1 IMPLANT
FACESHIELD WRAPAROUND (MASK) ×3 IMPLANT
GLOVE BIO SURGEON STRL SZ7 (GLOVE) ×3 IMPLANT
GLOVE BIOGEL PI IND STRL 7.0 (GLOVE) ×1 IMPLANT
GLOVE BIOGEL PI IND STRL 7.5 (GLOVE) ×1 IMPLANT
GLOVE BIOGEL PI INDICATOR 7.0 (GLOVE) ×2
GLOVE BIOGEL PI INDICATOR 7.5 (GLOVE) ×2
GLOVE SS BIOGEL STRL SZ 7.5 (GLOVE) ×1 IMPLANT
GLOVE SUPERSENSE BIOGEL SZ 7.5 (GLOVE) ×2
GOWN STRL REUS W/ TWL LRG LVL3 (GOWN DISPOSABLE) ×1 IMPLANT
GOWN STRL REUS W/ TWL XL LVL3 (GOWN DISPOSABLE) ×2 IMPLANT
GOWN STRL REUS W/TWL LRG LVL3 (GOWN DISPOSABLE) ×2
GOWN STRL REUS W/TWL XL LVL3 (GOWN DISPOSABLE) ×4
HANDPIECE INTERPULSE COAX TIP (DISPOSABLE) ×2
HOOD PEEL AWAY FACE SHEILD DIS (HOOD) ×6 IMPLANT
IMMOBILIZER KNEE 22 UNIV (SOFTGOODS) ×3 IMPLANT
KIT BASIN OR (CUSTOM PROCEDURE TRAY) ×3 IMPLANT
KIT ROOM TURNOVER OR (KITS) ×3 IMPLANT
MANIFOLD NEPTUNE II (INSTRUMENTS) ×3 IMPLANT
MARKER SKIN DUAL TIP RULER LAB (MISCELLANEOUS) ×3 IMPLANT
NEEDLE 18GX1X1/2 (RX/OR ONLY) (NEEDLE) ×3 IMPLANT
NS IRRIG 1000ML POUR BTL (IV SOLUTION) ×3 IMPLANT
PACK TOTAL JOINT (CUSTOM PROCEDURE TRAY) ×3 IMPLANT
PAD ARMBOARD 7.5X6 YLW CONV (MISCELLANEOUS) ×6 IMPLANT
SET HNDPC FAN SPRY TIP SCT (DISPOSABLE) ×1 IMPLANT
STRIP CLOSURE SKIN 1/2X4 (GAUZE/BANDAGES/DRESSINGS) ×2 IMPLANT
SUCTION FRAZIER HANDLE 10FR (MISCELLANEOUS) ×2
SUCTION TUBE FRAZIER 10FR DISP (MISCELLANEOUS) ×1 IMPLANT
SUT MNCRL AB 3-0 PS2 18 (SUTURE) ×3 IMPLANT
SUT VIC AB 0 CT1 27 (SUTURE) ×4
SUT VIC AB 0 CT1 27XBRD ANBCTR (SUTURE) ×2 IMPLANT
SUT VIC AB 1 CT1 27 (SUTURE) ×2
SUT VIC AB 1 CT1 27XBRD ANBCTR (SUTURE) ×1 IMPLANT
SUT VIC AB 2-0 CT1 27 (SUTURE) ×4
SUT VIC AB 2-0 CT1 TAPERPNT 27 (SUTURE) ×2 IMPLANT
SYR 30ML LL (SYRINGE) ×3 IMPLANT
TOWEL OR 17X24 6PK STRL BLUE (TOWEL DISPOSABLE) ×3 IMPLANT
TOWEL OR 17X26 10 PK STRL BLUE (TOWEL DISPOSABLE) ×3 IMPLANT
TRAY CATH 16FR W/PLASTIC CATH (SET/KITS/TRAYS/PACK) IMPLANT
TRAY FOLEY CATH SILVER 16FR (SET/KITS/TRAYS/PACK) ×3 IMPLANT
TUBE CONNECTING 12'X1/4 (SUCTIONS) ×1
TUBE CONNECTING 12X1/4 (SUCTIONS) ×2 IMPLANT
YANKAUER SUCT BULB TIP NO VENT (SUCTIONS) ×3 IMPLANT

## 2016-11-02 NOTE — Evaluation (Signed)
Physical Therapy Evaluation Patient Details Name: Christy Randall MRN: [id number] DOB: 02/03/1966 Today's Date: 11/02/2016   History of Present Illness  Pt is a 51 yo female with L knee pain secondary to end stage knee OA, s/p L TKA 11/02/16. PMH significant for COPD, OA and anxiety.    Clinical Impression  Pt is s/p TKA resulting in the deficits listed below (see PT Problem List). Pt is currently minA for bed mobility, transfers to RW, and ambulation of 8 feet with RW.  Pt will benefit from skilled PT to increase their independence and safety with mobility to allow discharge to the venue listed below.      Follow Up Recommendations Home health PT    Equipment Recommendations  Rolling walker with 5" wheels    Recommendations for Other Services OT consult     Precautions / Restrictions Precautions Precautions: Knee Precaution Booklet Issued: No Required Braces or Orthoses: Knee Immobilizer - Left Restrictions Weight Bearing Restrictions: Yes LLE Weight Bearing: Weight bearing as tolerated      Mobility  Bed Mobility Overal bed mobility: Needs Assistance Bed Mobility: Sit to Supine;Supine to Sit     Supine to sit: Min assist;HOB elevated Sit to supine: Min assist   General bed mobility comments: minA for LE management to and from floor  Transfers Overall transfer level: Needs assistance Equipment used: Rolling walker (2 wheeled) Transfers: Sit to/from Stand Sit to Stand: Min assist         General transfer comment: minA for steadying in upright, vc for hand placement  Ambulation/Gait Ambulation/Gait assistance: Min assist Ambulation Distance (Feet): 8 Feet Assistive device: Rolling walker (2 wheeled) Gait Pattern/deviations: Step-to pattern;Decreased stance time - left;Decreased step length - right;Decreased weight shift to left;Antalgic;Trunk flexed Gait velocity: slowed Gait velocity interpretation: Below normal speed for age/gender General Gait Details: minA  for safety, vc for sequencing and weightshifting, as well as upright posture and heel strike       Balance Overall balance assessment: Needs assistance Sitting-balance support: Feet supported;No upper extremity supported Sitting balance-Leahy Scale: Good     Standing balance support: Bilateral upper extremity supported Standing balance-Leahy Scale: Poor Standing balance comment: requires RW for steadying                             Pertinent Vitals/Pain Pain Assessment: 0-10 Pain Score: 10-Worst pain ever Pain Location: L knee Pain Descriptors / Indicators: Constant;Sharp;Stabbing;Aching;Operative site guarding;Grimacing;Guarding Pain Intervention(s): RN gave pain meds during session;Monitored during session;Limited activity within patient's tolerance;Repositioned  Pt on 2 L O2 via nasal cannula SaO2 at >95% O2. Pt ambulated on RA and SaO2 >92% throughout. 2L O2 replaced at end of session as pt was planning on napping. VSS    Home Living Family/patient expects to be discharged to:: Private residence Living Arrangements: Spouse/significant other Available Help at Discharge: Family;Available 24 hours/day Type of Home: House Home Access: Stairs to enter Entrance Stairs-Rails: None Entrance Stairs-Number of Steps: 1 Home Layout: One level Home Equipment: Shower seat;Grab bars - tub/shower;Hand held shower head      Prior Function Level of Independence: Independent         Comments: Advertising copywriter        Extremity/Trunk Assessment   Upper Extremity Assessment Upper Extremity Assessment: Defer to OT evaluation    Lower Extremity Assessment Lower Extremity Assessment: LLE deficits/detail LLE Deficits / Details: L TKA, decreased hip and knee ROM and strength due  to surgical intervention LLE: Unable to fully assess due to pain    Cervical / Trunk Assessment Cervical / Trunk Assessment: Normal  Communication   Communication: No  difficulties  Cognition Arousal/Alertness: Awake/alert Behavior During Therapy: WFL for tasks assessed/performed Overall Cognitive Status: Within Functional Limits for tasks assessed                                           Exercises Total Joint Exercises Ankle Circles/Pumps: AROM;Both;10 reps;Supine Heel Slides: AROM;Left;10 reps;Supine   Assessment/Plan    PT Assessment Patient needs continued PT services  PT Problem List Decreased strength;Decreased range of motion;Decreased activity tolerance;Decreased balance;Decreased mobility;Decreased knowledge of use of DME;Decreased safety awareness;Pain       PT Treatment Interventions DME instruction;Gait training;Stair training;Functional mobility training;Therapeutic activities;Therapeutic exercise;Balance training;Patient/family education    PT Goals (Current goals can be found in the Care Plan section)  Acute Rehab PT Goals Patient Stated Goal: go home soon PT Goal Formulation: With patient/family Time For Goal Achievement: 11/09/16 Potential to Achieve Goals: Good    Frequency 7X/week    AM-PAC PT "6 Clicks" Daily Activity  Outcome Measure Difficulty turning over in bed (including adjusting bedclothes, sheets and blankets)?: A Lot Difficulty moving from lying on back to sitting on the side of the bed? : Total Difficulty sitting down on and standing up from a chair with arms (e.g., wheelchair, bedside commode, etc,.)?: Total Help needed moving to and from a bed to chair (including a wheelchair)?: A Little Help needed walking in hospital room?: A Little Help needed climbing 3-5 steps with a railing? : Total 6 Click Score: 11    End of Session Equipment Utilized During Treatment: Gait belt;Left knee immobilizer Activity Tolerance: Patient limited by pain Patient left: in bed;with call bell/phone within reach;with family/visitor present Nurse Communication: Mobility status;Precautions;Weight bearing  status PT Visit Diagnosis: Unsteadiness on feet (R26.81);Other abnormalities of gait and mobility (R26.89);Muscle weakness (generalized) (M62.81);Difficulty in walking, not elsewhere classified (R26.2);Pain Pain - Right/Left: Left Pain - part of body: Knee    Time: 5038-8828 PT Time Calculation (min) (ACUTE ONLY): 46 min   Charges:   PT Evaluation $PT Eval Low Complexity: 1 Procedure PT Treatments $Gait Training: 8-22 mins $Therapeutic Exercise: 8-22 mins   PT G Codes:        Arnet Hofferber B. Migdalia Dk PT, DPT Acute Rehabilitation  [phone] Pager [phone]    [place] 11/02/2016, 3:37 PM

## 2016-11-02 NOTE — Anesthesia Postprocedure Evaluation (Signed)
Anesthesia Post Note  Patient: Christy Randall  Procedure(s) Performed: Procedure(s) (LRB): TOTAL KNEE ARTHROPLASTY (Left)     Patient location during evaluation: PACU Anesthesia Type: Spinal and Regional Level of consciousness: awake and alert Pain management: pain level controlled Vital Signs Assessment: post-procedure vital signs reviewed and stable Respiratory status: spontaneous breathing and respiratory function stable Cardiovascular status: blood pressure returned to baseline and stable Postop Assessment: spinal receding Anesthetic complications: no    Last Vitals:  Vitals:   11/02/16 1030 11/02/16 1045  BP: 117/73 121/79  Pulse:    Resp:    Temp:      Last Pain:  Vitals:   11/02/16 1045  TempSrc:   PainSc: 4                  Jeniffer Culliver,W. EDMOND

## 2016-11-02 NOTE — Op Note (Signed)
MRN:     [id number] DOB/AGE:    1965/09/01 / 51 y.o.       OPERATIVE REPORT    DATE OF PROCEDURE:  11/02/2016       PREOPERATIVE DIAGNOSIS:   Primary localized OA left knee      Estimated body mass index is 44.93 kg/m as calculated from the following:   Height as of this encounter: 5\' 5"  (1.651 m).   Weight as of this encounter: 122.5 kg (270 lb).                                                        POSTOPERATIVE DIAGNOSIS:   same                                                                       PROCEDURE:  Procedure(s): TOTAL KNEE ARTHROPLASTY Using Depuy Attune RP implants #6 narrow Femur, #6Tibia, 61mm  RP bearing, 32 Patella     SURGEON: Shanyce Daris A    ASSISTANT:  Kirstin Shepperson PA-C   (Present and scrubbed throughout the case, critical for assistance with exposure, retraction, instrumentation, and closure.)         ANESTHESIA: Spinal with Adductor Nerve Block     TOURNIQUET TIME: 10XNA   COMPLICATIONS:  None     SPECIMENS: None   INDICATIONS FOR PROCEDURE: The patient has  djd left knee, varus deformities, XR shows bone on bone arthritis. Patient has failed all conservative measures including anti-inflammatory medicines, narcotics, attempts at  exercise and weight loss, cortisone injections and viscosupplementation.  Risks and benefits of surgery have been discussed, questions answered.   DESCRIPTION OF PROCEDURE: The patient identified by armband, received  right femoral nerve block and IV antibiotics, in the holding area at [facility]. Patient taken to the operating room, appropriate anesthetic  monitors were attached Spinal anesthesia induced with  the patient in supine position, Foley catheter was inserted. Tourniquet  applied high to the operative thigh. Lateral post and foot positioner  applied to the table, the lower extremity was then prepped and draped  in usual sterile fashion from the ankle to the tourniquet. Time-out procedure was  performed. The limb was wrapped with an Esmarch bandage and the tourniquet inflated to 365 mmHg. We began the operation by making the anterior midline incision starting at handbreadth above the patella going over the patella 1 cm medial to and  4 cm distal to the tibial tubercle. Small bleeders in the skin and the  subcutaneous tissue identified and cauterized. Transverse retinaculum was incised and reflected medially and a medial parapatellar arthrotomy was accomplished. the patella was everted and theprepatellar fat pad resected. The superficial medial collateral  ligament was then elevated from anterior to posterior along the proximal  flare of the tibia and anterior half of the menisci resected. The knee was hyperflexed exposing bone on bone arthritis. Peripheral and notch osteophytes as well as the cruciate ligaments were then resected. We continued to  work our way around posteriorly along the proximal tibia, and externally  rotated the tibia subluxing  it out from underneath the femur. A McHale  retractor was placed through the notch and a lateral Hohmann retractor  placed, and we then drilled through the proximal tibia in line with the  axis of the tibia followed by an intramedullary guide rod and 2-degree  posterior slope cutting guide. The tibial cutting guide was pinned into place  allowing resection of 4 mm of bone medially and about 6 mm of bone  laterally because of her varus deformity. Satisfied with the tibial resection, we then  entered the distal femur 2 mm anterior to the PCL origin with the  intramedullary guide rod and applied the distal femoral cutting guide  set at 79mm, with 5 degrees of valgus. This was pinned along the  epicondylar axis. At this point, the distal femoral cut was accomplished without difficulty. We then sized for a #6 femoral component and pinned the guide in 3 degrees of external rotation.The chamfer cutting guide was pinned into place. The anterior,  posterior, and chamfer cuts were accomplished without difficulty followed by  the  RP box cutting guide and the box cut. We also removed posterior osteophytes from the posterior femoral condyles. At this  time, the knee was brought into full extension. We checked our  extension and flexion gaps and found them symmetric at 68mm.  The patella thickness measured at 25 mm. We set the cutting guide at 15 and removed the posterior 9.5-10 mm  of the patella sized for 32 button and drilled the lollipop. The knee  was then once again hyperflexed exposing the proximal tibia. We sized for a #6 tibial base plate, applied the smokestack and the conical reamer followed by the the Delta fin keel punch. We then hammered into place the  RP trial femoral component, inserted a 1 trial bearing, trial patellar button, and took the knee through range of motion from 0-130 degrees. No thumb pressure was required for patellar  tracking. At this point, all trial components were removed, a double batch of DePuy HV cement with 1500 mg of Tobramycin was mixed and applied to all bony metallic mating surfaces except for the posterior condyles of the femur itself. In order, we  hammered into place the tibial tray and removed excess cement, the femoral component and removed excess cement, a 21mm  RP bearing  was inserted, and the knee brought to full extension with compression.  The patellar button was clamped into place, and excess cement  removed. While the cement cured the wound was irrigated out with normal saline solution pulse lavage.. Ligament stability and patellar tracking were checked and found to be excellent.. The parapatellar arthrotomy was closed with  #1 Vicryl suture. The subcutaneous tissue with 0 and 2-0 undyed  Vicryl suture, and 4-0 Monocryl.. A dressing of Aquaseal,  4 x 4, dressing sponges, Webril, and Ace wrap applied. Needle and sponge count were correct times 2.The patient awakened, extubated, and taken to  recovery room without difficulty. Vascular status was normal, pulses 2+ and symmetric.   Christy Randall A 11/02/2016, 8:52 AM

## 2016-11-02 NOTE — Progress Notes (Signed)
Orthopedic Tech Progress Note Patient Details:  Christy Randall [birth year] [id number]  CPM Left Knee CPM Left Knee: On Left Knee Flexion (Degrees): 90 Left Knee Extension (Degrees): 0 Additional Comments: foot roll   [name] 11/02/2016, 10:16 AM

## 2016-11-02 NOTE — Anesthesia Procedure Notes (Addendum)
Anesthesia Regional Block: Adductor canal block   Pre-Anesthetic Checklist: ,, timeout performed, Correct Patient, Correct Site, Correct Laterality, Correct Procedure, Correct Position, site marked, Risks and benefits discussed, pre-op evaluation,  At surgeon's request and post-op pain management  Laterality: Left  Prep: Maximum Sterile Barrier Precautions used, chloraprep       Needles:  Injection technique: Single-shot  Needle Type: Echogenic Stimulator Needle     Needle Length: 9cm  Needle Gauge: 21     Additional Needles:   Procedures: ultrasound guided,,,,,,,,  Narrative:  Start time: 11/02/2016 6:54 AM End time: 11/02/2016 7:04 AM Injection made incrementally with aspirations every 5 mL. Anesthesiologist: Roderic Palau  Additional Notes: 2% Lidocaine skin wheel.

## 2016-11-02 NOTE — Transfer of Care (Signed)
Immediate Anesthesia Transfer of Care Note  Patient: Christy Randall  Procedure(s) Performed: Procedure(s): TOTAL KNEE ARTHROPLASTY (Left)  Patient Location: PACU  Anesthesia Type:Spinal  Level of Consciousness: awake  Airway & Oxygen Therapy: Patient Spontanous Breathing and Patient connected to nasal cannula oxygen  Post-op Assessment: Report given to RN and Post -op Vital signs reviewed and stable  Post vital signs: Reviewed and stable  Last Vitals:  Vitals:   10/21/16 1400 11/02/16 0638  BP: 132/70 (!) 180/85  Pulse: 68 (!) 57  Resp:  20  Temp: 36.6 C 36.6 C    Last Pain:  Vitals:   11/02/16 0638  TempSrc: Oral         Complications: No apparent anesthesia complications

## 2016-11-02 NOTE — Interval H&P Note (Signed)
History and Physical Interval Note:  11/02/2016 6:26 AM  Christy Randall  has presented today for surgery, with the diagnosis of djd left knee  The various methods of treatment have been discussed with the patient and family. After consideration of risks, benefits and other options for treatment, the patient has consented to  Procedure(s): TOTAL KNEE ARTHROPLASTY (Left) as a surgical intervention .  The patient's history has been reviewed, patient examined, no change in status, stable for surgery.  I have reviewed the patient's chart and labs.  Questions were answered to the patient's satisfaction.     Elsie Saas A

## 2016-11-02 NOTE — Anesthesia Procedure Notes (Signed)
Spinal  Patient location during procedure: OR Start time: 11/02/2016 7:15 AM End time: 11/02/2016 7:19 AM Staffing Anesthesiologist: Roderic Palau Performed: anesthesiologist  Preanesthetic Checklist Completed: patient identified, surgical consent, pre-op evaluation, timeout performed, IV checked, risks and benefits discussed and monitors and equipment checked Spinal Block Patient position: sitting Prep: DuraPrep Patient monitoring: cardiac monitor, continuous pulse ox and blood pressure Approach: midline Location: L3-4 Injection technique: single-shot Needle Needle type: Pencan  Needle gauge: 24 G Needle length: 9 cm Assessment Sensory level: T8 Additional Notes Functioning IV was confirmed and monitors were applied. Sterile prep and drape, including hand hygiene and sterile gloves were used. The patient was positioned and the spine was prepped. The skin was anesthetized with lidocaine.  Free flow of clear CSF was obtained prior to injecting local anesthetic into the CSF.  The spinal needle aspirated freely following injection.  The needle was carefully withdrawn.  The patient tolerated the procedure well.

## 2016-11-02 NOTE — Progress Notes (Signed)
RT set up CPAP for patient. RT placed patient on auto 82max and 49min. 2L O2 bleed in needed. Patient tolerating well.

## 2016-11-03 ENCOUNTER — Encounter (HOSPITAL_COMMUNITY): Payer: Self-pay | Admitting: Physician Assistant

## 2016-11-03 DIAGNOSIS — M545 Low back pain, unspecified: Secondary | ICD-10-CM | POA: Diagnosis present

## 2016-11-03 DIAGNOSIS — M79605 Pain in left leg: Secondary | ICD-10-CM

## 2016-11-03 LAB — CBC
HEMATOCRIT: 38 % (ref 36.0–46.0)
HEMOGLOBIN: 12.4 g/dL (ref 12.0–15.0)
MCH: 30.7 pg (ref 26.0–34.0)
MCHC: 32.6 g/dL (ref 30.0–36.0)
MCV: 94.1 fL (ref 78.0–100.0)
PLATELETS: 243 10*3/uL (ref 150–400)
RBC: 4.04 MIL/uL (ref 3.87–5.11)
RDW: 13.5 % (ref 11.5–15.5)
WBC: 17.2 10*3/uL — AB (ref 4.0–10.5)

## 2016-11-03 LAB — BASIC METABOLIC PANEL
Anion gap: 8 (ref 5–15)
BUN: 10 mg/dL (ref 6–20)
CHLORIDE: 102 mmol/L (ref 101–111)
CO2: 27 mmol/L (ref 22–32)
Calcium: 9.1 mg/dL (ref 8.9–10.3)
Creatinine, Ser: 0.91 mg/dL (ref 0.44–1.00)
GFR calc Af Amer: >60 mL/min (ref >60)
GLUCOSE: 208 mg/dL — AB (ref 65–99)
POTASSIUM: 5.1 mmol/L (ref 3.5–5.1)
Sodium: 137 mmol/L (ref 135–145)

## 2016-11-03 MED ORDER — SODIUM CHLORIDE 0.9 % IV BOLUS (SEPSIS)
500.0000 mL | Freq: Once | INTRAVENOUS | Status: AC
  Administered 2016-11-03: 500 mL via INTRAVENOUS

## 2016-11-03 MED ORDER — CEFAZOLIN SODIUM-DEXTROSE 2-4 GM/100ML-% IV SOLN
2.0000 g | Freq: Three times a day (TID) | INTRAVENOUS | Status: DC
  Administered 2016-11-03 – 2016-11-04 (×4): 2 g via INTRAVENOUS
  Filled 2016-11-03 (×5): qty 100

## 2016-11-03 MED ORDER — CEFAZOLIN SODIUM-DEXTROSE 2-4 GM/100ML-% IV SOLN
2.0000 g | Freq: Three times a day (TID) | INTRAVENOUS | Status: DC
  Filled 2016-11-03 (×2): qty 100

## 2016-11-03 NOTE — Progress Notes (Signed)
Foley d/c at 7:45

## 2016-11-03 NOTE — Progress Notes (Signed)
Physical Therapy Treatment Patient Details Name: Christy Randall MRN: [id number] DOB: Jun 11, 1965 Today's Date: 11/03/2016    History of Present Illness Pt is a 51 yo female with L knee pain secondary to end stage knee OA, s/p L TKA 11/02/16. PMH significant for COPD, OA and anxiety.      PT Comments    Pt is making good progress towards her goals. Pt currently, supervision for transfers and minA for ambulation of 510 feet with RW. Pt requires skilled PT to progress gait training and start stair training as well as to improve LE strength,ROM and endurance to safely navigate in her discharge environment.   Follow Up Recommendations  Home health PT     Equipment Recommendations  Rolling walker with 5" wheels    Recommendations for Other Services OT consult     Precautions / Restrictions Precautions Precautions: Knee Precaution Booklet Issued: No Required Braces or Orthoses: Knee Immobilizer - Left Restrictions Weight Bearing Restrictions: Yes LLE Weight Bearing: Weight bearing as tolerated    Mobility  Bed Mobility               General bed mobility comments: in recliner at entry  Transfers Overall transfer level: Needs assistance Equipment used: Rolling walker (2 wheeled) Transfers: Sit to/from Stand Sit to Stand: Supervision         General transfer comment: supervision for safety, vc for hand placement   Ambulation/Gait Ambulation/Gait assistance: Min assist Ambulation Distance (Feet): 510 Feet Assistive device: Rolling walker (2 wheeled) Gait Pattern/deviations: Decreased weight shift to left;Antalgic;Trunk flexed;Step-through pattern Gait velocity: slowed Gait velocity interpretation: Below normal speed for age/gender General Gait Details: minA for safety, vc for weightshifting, upright posture and anterior pelvic tilt       Balance Overall balance assessment: Needs assistance Sitting-balance support: Feet supported;No upper extremity  supported Sitting balance-Leahy Scale: Good     Standing balance support: Bilateral upper extremity supported Standing balance-Leahy Scale: Good Standing balance comment: able to stand without UE support                            Cognition Arousal/Alertness: Awake/alert Behavior During Therapy: WFL for tasks assessed/performed Overall Cognitive Status: Within Functional Limits for tasks assessed                                        Exercises Total Joint Exercises Ankle Circles/Pumps: AROM;Both;10 reps;Supine Quad Sets: AROM;Left;10 reps;Supine Long Arc Quad: AROM;Left;10 reps;Seated Knee Flexion: AROM;AAROM;10 reps;Seated Goniometric ROM: 6 degrees extension measured supine 80 degrees of flexion measured in chair    General Comments General comments (skin integrity, edema, etc.): Pt husband and daughter present throughout session      Pertinent Vitals/Pain Pain Assessment: 0-10 Pain Score: 4  Pain Location: L knee Pain Descriptors / Indicators: Constant;Sharp;Stabbing;Aching;Operative site guarding;Grimacing;Guarding Pain Intervention(s): Patient requesting pain meds-RN notified;RN gave pain meds during session;Monitored during session  [place] expects to be discharged to:: Private residence Living Arrangements: Spouse/significant other Available Help at Discharge: Family;Available 24 hours/day Type of Home: House Home Access: Stairs to enter Entrance Stairs-Rails: None Home Layout: One level Home Equipment: Shower seat;Hand held shower head;Grab bars - tub/shower      Prior Function Level of Independence: Independent;Needs assistance      Comments: Pt performing majority of ADLs. Husband needed to A with  LB ADLs    PT Goals (current goals can now be found in the care plan section) Acute Rehab PT Goals Patient Stated Goal: go home soon PT Goal Formulation: With patient/family Time For Goal Achievement:  11/09/16 Potential to Achieve Goals: Good Progress towards PT goals: Progressing toward goals    Frequency    7X/week      PT Plan Current plan remains appropriate       AM-PAC PT "6 Clicks" Daily Activity  Outcome Measure  Difficulty turning over in bed (including adjusting bedclothes, sheets and blankets)?: A Lot Difficulty moving from lying on back to sitting on the side of the bed? : A Lot Difficulty sitting down on and standing up from a chair with arms (e.g., wheelchair, bedside commode, etc,.)?: A Lot Help needed moving to and from a bed to chair (including a wheelchair)?: A Little Help needed walking in hospital room?: A Little Help needed climbing 3-5 steps with a railing? : Total 6 Click Score: 13    End of Session Equipment Utilized During Treatment: Gait belt;Left knee immobilizer Activity Tolerance: Patient limited by pain Patient left: in bed;with call bell/phone within reach;with family/visitor present Nurse Communication: Mobility status;Precautions;Weight bearing status PT Visit Diagnosis: Unsteadiness on feet (R26.81);Other abnormalities of gait and mobility (R26.89);Muscle weakness (generalized) (M62.81);Difficulty in walking, not elsewhere classified (R26.2);Pain Pain - Right/Left: Left Pain - part of body: Knee     Time: 1610-9604 PT Time Calculation (min) (ACUTE ONLY): 43 min  Charges:  $Gait Training: 8-22 mins $Therapeutic Exercise: 8-22 mins $Therapeutic Activity: 8-22 mins                    G Codes:       Shatara Stanek B. Migdalia Dk PT, DPT Acute Rehabilitation  [phone] Pager [phone]     [place] 11/03/2016, 12:11 PM

## 2016-11-03 NOTE — Progress Notes (Signed)
Pt places self on/off machine.  (Our machine, her mask and tubing).  Rt will monitor.

## 2016-11-03 NOTE — Progress Notes (Addendum)
Physical Therapy Treatment Patient Details Name: Christy Randall MRN: [id number] DOB: 1965/09/09 Today's Date: 11/03/2016    History of Present Illness Pt is a 51 yo female with L knee pain secondary to end stage knee OA, s/p L TKA 11/02/16. PMH significant for COPD, OA and anxiety.      PT Comments    Pt is making good progress towards her goals. Pt currently, supervision for bed mobility and transfers to RW and min guard for ambulation of 600 feet with RW> Pt requires skilled PT to progress gait training and start stair training and to improve LE strength, ROM and endurance to safely navigate in their home environment.    Follow Up Recommendations  Home health PT     Equipment Recommendations  Rolling walker with 5" wheels    Recommendations for Other Services OT consult     Precautions / Restrictions Precautions Precautions: Knee Precaution Booklet Issued: No Required Braces or Orthoses: Knee Immobilizer - Left Restrictions Weight Bearing Restrictions: Yes LLE Weight Bearing: Weight bearing as tolerated    Mobility  Bed Mobility Overal bed mobility: Needs Assistance Bed Mobility: Sit to Supine     Supine to sit: Supervision     General bed mobility comments: uses bed rail assist to come EoB  Transfers Overall transfer level: Needs assistance Equipment used: Rolling walker (2 wheeled) Transfers: Sit to/from Stand Sit to Stand: Supervision         General transfer comment: supervision for safety, vc for hand placement   Ambulation/Gait Ambulation/Gait assistance: Min assist Ambulation Distance (Feet): 600 Feet Assistive device: Rolling walker (2 wheeled) Gait Pattern/deviations: Decreased weight shift to left;Trunk flexed;Step-through pattern Gait velocity: slowed Gait velocity interpretation: Below normal speed for age/gender General Gait Details: min guard for safety vc for heel strike and upright posture as well as RW management          Balance  Overall balance assessment: Needs assistance Sitting-balance support: Feet supported;No upper extremity supported Sitting balance-Leahy Scale: Good     Standing balance support: Bilateral upper extremity supported Standing balance-Leahy Scale: Good Standing balance comment: able to stand without UE support                            Cognition Arousal/Alertness: Awake/alert Behavior During Therapy: WFL for tasks assessed/performed Overall Cognitive Status: Within Functional Limits for tasks assessed                                        Exercises Total Joint Exercises Ankle Circles/Pumps: AROM;Both;10 reps;Supine Quad Sets: AROM;Left;10 reps;Supine Long Arc Quad: AROM;Left;10 reps;Seated Knee Flexion: AROM;AAROM;10 reps;Seated Goniometric ROM: 6 degrees extension measured supine 80 degrees of flexion measured in chair    General Comments General comments (skin integrity, edema, etc.): Pt daughter and husband present during treatment session      Pertinent Vitals/Pain Pain Assessment: 0-10 Pain Score: 4  Pain Location: L knee Pain Descriptors / Indicators: Constant;Sharp;Stabbing;Aching;Operative site guarding;Grimacing;Guarding Pain Intervention(s): Patient requesting pain meds-RN notified;RN gave pain meds during session;Monitored during session  VSS           PT Goals (current goals can now be found in the care plan section) Acute Rehab PT Goals Patient Stated Goal: go home soon PT Goal Formulation: With patient/family Time For Goal Achievement: 11/09/16 Potential to Achieve Goals: Good Progress towards PT goals: Progressing  toward goals    Frequency    7X/week      PT Plan Current plan remains appropriate       AM-PAC PT "6 Clicks" Daily Activity  Outcome Measure  Difficulty turning over in bed (including adjusting bedclothes, sheets and blankets)?: A Lot Difficulty moving from lying on back to sitting on the side of the  bed? : A Lot Difficulty sitting down on and standing up from a chair with arms (e.g., wheelchair, bedside commode, etc,.)?: A Lot Help needed moving to and from a bed to chair (including a wheelchair)?: A Little Help needed walking in hospital room?: A Little Help needed climbing 3-5 steps with a railing? : Total 6 Click Score: 13    End of Session Equipment Utilized During Treatment: Gait belt;Left knee immobilizer Activity Tolerance: Patient limited by pain Patient left: in bed;with call bell/phone within reach;with family/visitor present Nurse Communication: Mobility status;Precautions;Weight bearing status PT Visit Diagnosis: Unsteadiness on feet (R26.81);Other abnormalities of gait and mobility (R26.89);Muscle weakness (generalized) (M62.81);Difficulty in walking, not elsewhere classified (R26.2);Pain Pain - Right/Left: Left Pain - part of body: Knee     Time: 8979-1504 PT Time Calculation (min) (ACUTE ONLY): 24 min  Charges:  $Gait Training: 23-37 mins $Therapeutic Exercise: 8-22 mins $Therapeutic Activity: 8-22 mins                    G Codes:       Delores Thelen B. Migdalia Dk PT, DPT Acute Rehabilitation  [phone] Pager [phone]     [place] 11/03/2016, 2:32 PM

## 2016-11-03 NOTE — Progress Notes (Addendum)
Subjective: 1 Day Post-Op Procedure(s) (LRB): TOTAL KNEE ARTHROPLASTY (Left) Patient reports pain as 7 on 0-10 scale.    Objective: Vital signs in last 24 hours: Temp:  [97.5 F (36.4 C)-98.2 F (36.8 C)] 97.8 F (36.6 C) (06/12 0437) Pulse Rate:  [55-74] 55 (06/12 0437) Resp:  [12-20] 18 (06/12 0437) BP: (101-149)/(51-82) 130/61 (06/12 0437) SpO2:  [91 %-97 %] 91 % (06/12 0437)  Intake/Output from previous day: 06/11 0701 - 06/12 0700 In: 1120 [P.O.:120; I.V.:1000] Out: 1040 [Urine:1025; Blood:15] Intake/Output this shift: Total I/O In: -  Out: 350 [Urine:350]   Recent Labs  11/03/16 0428  HGB 12.4    Recent Labs  11/03/16 0428  WBC 17.2*  RBC 4.04  HCT 38.0  PLT 243    Recent Labs  11/03/16 0428  NA 137  K 5.1  CL 102  CO2 27  BUN 10  CREATININE 0.91  GLUCOSE 208*  CALCIUM 9.1   No results for input(s): LABPT, INR in the last 72 hours.  ABD soft Neurovascular intact Sensation intact distally Intact pulses distally Incision: dressing C/D/I and scant drainage  Assessment/Plan: 1 Day Post-Op Procedure(s) (LRB): TOTAL KNEE ARTHROPLASTY (Left)  Principal Problem:   Primary localized osteoarthrosis of the knee, left Active Problems:   Sleep apnea uses CPAP   Hypothyroid   GERD (gastroesophageal reflux disease)   COPD (chronic obstructive pulmonary disease) (HCC)   Anxiety   Low back pain radiating to left lower extremity  Advance diet Up with therapy Plan for discharge tomorrow Discharge home with home health  Linda Hedges 11/03/2016, 8:42 AM

## 2016-11-03 NOTE — Care Management Note (Signed)
Case Management Note  Patient Details  Name: Christy Randall MRN: [id number] Date of Birth: Sep 23, 1965  Subjective/Objective:   51 yr old female s/p left total knee arthroplasty.                 Action/Plan: Case manager spoke with patient and her family concerning discharge plan and DME needs. Patient was initially setup with [facility], they are not able to service her area. Patient has requested that [facility] be contacted. CM called referral to Christa See, [facility], they are able to provide [facility] therapy. DME has been delivered to patient's room. She will have family support at discharge.  Expected Discharge Date:   11/04/16               Expected Discharge Plan:  [place]  In-House Referral:  NA  Discharge planning Services  CM Consult  Post Acute Care Choice:  Durable Medical Equipment, Home Health Choice offered to:  Patient  DME Arranged:  3-N-1, Walker rolling, Other see comment Media planner) DME Agency:  [employer]  HH Arranged:  PT HH Agency:  [facility] (formerly [employer])  Status of Service:  Completed, signed off  If discussed at [employer], dates discussed:    Additional Comments:  Ninfa Meeker, RN 11/03/2016, 12:06 PM

## 2016-11-03 NOTE — Evaluation (Signed)
Occupational Therapy Evaluation Patient Details Name: Christy Randall MRN: [id number] DOB: [birth year] Today's Date: 11/03/2016    History of Present Illness Pt is a 51 yo female with L knee pain secondary to end stage knee OA, s/p L TKA 11/02/16. PMH significant for COPD, OA and anxiety.     Clinical Impression   PTA, pt was living with her husband and was independent in ADLs with exception of LB dressing (and her husband would assist). Currently, pt requires Min guard-Min A for ADLs and Min guard A for functional mobility using RW. Provided education on LB ADLs with AE and toilet transfer; pt demonstrated understanding. Answered all pt questions. Pt would benefit from further OT to address shower transfer and facilitate safe dc home. Recommend dc home once medically stable per physician.      Follow Up Recommendations  DC plan and follow up therapy as arranged by surgeon;Supervision/Assistance - 24 hour    Equipment Recommendations  3 in 1 bedside commode    Recommendations for Other Services PT consult     Precautions / Restrictions Precautions Precautions: Knee Precaution Booklet Issued: No Required Braces or Orthoses: Knee Immobilizer - Left Restrictions Weight Bearing Restrictions: Yes LLE Weight Bearing: Weight bearing as tolerated      Mobility Bed Mobility               General bed mobility comments: Sitting on 3N1 at sink finishing bathing with NT  Transfers Overall transfer level: Needs assistance Equipment used: Rolling walker (2 wheeled) Transfers: Sit to/from Stand Sit to Stand: Min guard         General transfer comment: Min guard for safety    Balance Overall balance assessment: Needs assistance Sitting-balance support: Feet supported;No upper extremity supported Sitting balance-Leahy Scale: Good     Standing balance support: Bilateral upper extremity supported Standing balance-Leahy Scale: Good Standing balance comment: Pt able to maitain  dynamic balance during bathing and dressing                           ADL either performed or assessed with clinical judgement   ADL Overall ADL's : Needs assistance/impaired Eating/Feeding: Set up;Sitting   Grooming: Min guard;Standing   Upper Body Bathing: Minimal assistance;Sitting Upper Body Bathing Details (indicate cue type and reason): Min A for back Lower Body Bathing: Minimal assistance;Sit to/from stand   Upper Body Dressing : Set up;Sitting   Lower Body Dressing: Min guard;Sit to/from stand;With adaptive equipment Lower Body Dressing Details (indicate cue type and reason): Educated pt on AE. Pt demonstrated good understanding. Discussed different options for purchasing AE. Toilet Transfer: Min guard;Ambulation;BSC;RW   Toileting- Water quality scientist and Hygiene: Min guard;Sit to/from Nurse, children's Details (indicate cue type and reason): Needs education on shower transfer Functional mobility during ADLs: Min guard;Rolling walker General ADL Comments: Pt progressing well after TKA. Provided education on LB dressing with AE to increase pt independence.     Vision         Perception     Praxis      Pertinent Vitals/Pain Pain Assessment: 0-10 Pain Score: 4  Pain Location: L knee Pain Descriptors / Indicators: Constant;Sharp;Stabbing;Aching;Operative site guarding;Grimacing;Guarding Pain Intervention(s): Patient requesting pain meds-RN notified;Monitored during session     Hand Dominance Right   Extremity/Trunk Assessment Upper Extremity Assessment Upper Extremity Assessment: Overall WFL for tasks assessed   Lower Extremity Assessment Lower Extremity Assessment: Defer to PT evaluation LLE Deficits /  Details: L TKA, decreased hip and knee ROM and strength due to surgical intervention LLE: Unable to fully assess due to pain   Cervical / Trunk Assessment Cervical / Trunk Assessment: Normal   Communication  Communication Communication: No difficulties   Cognition Arousal/Alertness: Awake/alert Behavior During Therapy: WFL for tasks assessed/performed Overall Cognitive Status: Within Functional Limits for tasks assessed                                     General Comments  Pt husband present throughout session and very willing to learn techniques to increase safety    Exercises     Shoulder Instructions      Home Living Family/patient expects to be discharged to:: Private residence Living Arrangements: Spouse/significant other Available Help at Discharge: Family;Available 24 hours/day Type of Home: House Home Access: Stairs to enter [employer] of Steps: 1 Entrance Stairs-Rails: None Home Layout: One level     Bathroom Shower/Tub: Occupational psychologist: Handicapped height Bathroom Accessibility: Yes   Home Equipment: Shower seat;Hand held shower head;Grab bars - tub/shower          Prior Functioning/Environment Level of Independence: Independent;Needs assistance        Comments: Pt performing majority of ADLs. Husband needed to A with LB ADLs         OT Problem List: Decreased strength;Decreased range of motion;Decreased activity tolerance;Impaired balance (sitting and/or standing);Decreased safety awareness;Decreased knowledge of use of DME or AE;Decreased knowledge of precautions;Obesity;Pain      OT Treatment/Interventions: Self-care/ADL training;Therapeutic exercise;Energy conservation;DME and/or AE instruction;Therapeutic activities;Patient/family education    OT Goals(Current goals can be found in the care plan section) Acute Rehab OT Goals Patient Stated Goal: go home tomorrow OT Goal Formulation: With patient Time For Goal Achievement: 11/17/16 Potential to Achieve Goals: Good ADL Goals Pt Will Perform Lower Body Dressing: with set-up;with supervision;with adaptive equipment;sit to/from stand Pt Will Transfer to  Toilet: with set-up;with supervision;bedside commode;ambulating Pt Will Perform Tub/Shower Transfer: Shower transfer;with min guard assist;shower seat;ambulating;rolling walker  OT Frequency: Min 2X/week   Barriers to D/C:            Co-evaluation              AM-PAC PT "6 Clicks" Daily Activity     Outcome Measure Help from another person eating meals?: None Help from another person taking care of personal grooming?: A Little Help from another person toileting, which includes using toliet, bedpan, or urinal?: A Little Help from another person bathing (including washing, rinsing, drying)?: A Little Help from another person to put on and taking off regular upper body clothing?: None Help from another person to put on and taking off regular lower body clothing?: A Little 6 Click Score: 20   End of Session Equipment Utilized During Treatment: Rolling walker CPM Left Knee Additional Comments: bone foam Nurse Communication: Mobility status;Patient requests pain meds  Activity Tolerance: Patient tolerated treatment well Patient left: in chair;with call bell/phone within reach;with family/visitor present (with PT)  OT Visit Diagnosis: Unsteadiness on feet (R26.81);Other abnormalities of gait and mobility (R26.89);Muscle weakness (generalized) (M62.81);Pain Pain - Right/Left: Left Pain - part of body: Knee                Time: 1610-9604 OT Time Calculation (min): 23 min Charges:  OT General Charges $OT Visit: 1 Procedure OT Evaluation $OT Eval Low Complexity: 1 Procedure OT  Treatments $Self Care/Home Management : 8-22 mins G-Codes:     Willmer Fellers MSOT, OTR/L Acute Rehab Pager: [phone] Office: [place] 11/03/2016, 10:39 AM

## 2016-11-04 ENCOUNTER — Inpatient Hospital Stay (HOSPITAL_COMMUNITY): Payer: BLUE CROSS/BLUE SHIELD

## 2016-11-04 LAB — BASIC METABOLIC PANEL
ANION GAP: 10 (ref 5–15)
BUN: 13 mg/dL (ref 6–20)
CHLORIDE: 100 mmol/L — AB (ref 101–111)
CO2: 27 mmol/L (ref 22–32)
Calcium: 8.5 mg/dL — ABNORMAL LOW (ref 8.9–10.3)
Creatinine, Ser: 0.87 mg/dL (ref 0.44–1.00)
GFR calc non Af Amer: >60 mL/min (ref >60)
Glucose, Bld: 235 mg/dL — ABNORMAL HIGH (ref 65–99)
POTASSIUM: 4.3 mmol/L (ref 3.5–5.1)
Sodium: 137 mmol/L (ref 135–145)

## 2016-11-04 LAB — CBC
HEMATOCRIT: 35.1 % — AB (ref 36.0–46.0)
HEMOGLOBIN: 11.3 g/dL — AB (ref 12.0–15.0)
MCH: 30.8 pg (ref 26.0–34.0)
MCHC: 32.2 g/dL (ref 30.0–36.0)
MCV: 95.6 fL (ref 78.0–100.0)
Platelets: 255 10*3/uL (ref 150–400)
RBC: 3.67 MIL/uL — ABNORMAL LOW (ref 3.87–5.11)
RDW: 14 % (ref 11.5–15.5)
WBC: 18.2 10*3/uL — AB (ref 4.0–10.5)

## 2016-11-04 MED ORDER — ASPIRIN 325 MG PO TBEC: 325.0000 mg | DELAYED_RELEASE_TABLET | Freq: Every day | ORAL | 0 refills | Status: DC

## 2016-11-04 MED ORDER — DIAZEPAM 2 MG PO TABS: 2.0000 mg | ORAL_TABLET | Freq: Four times a day (QID) | ORAL | 0 refills | Status: DC | PRN

## 2016-11-04 MED ORDER — MUPIROCIN 2 % EX OINT: 1.0000 "application " | TOPICAL_OINTMENT | Freq: Two times a day (BID) | CUTANEOUS | 0 refills | Status: DC

## 2016-11-04 MED ORDER — ACETAMINOPHEN 325 MG PO TABS: 650.0000 mg | ORAL_TABLET | Freq: Four times a day (QID) | ORAL | Status: DC | PRN

## 2016-11-04 MED ORDER — POLYETHYLENE GLYCOL 3350 17 G PO PACK
PACK | ORAL | 0 refills | Status: DC
Start: 2016-11-04 — End: 2017-05-10

## 2016-11-04 MED ORDER — DOCUSATE SODIUM 100 MG PO CAPS: ORAL_CAPSULE | ORAL | 0 refills | Status: DC

## 2016-11-04 MED ORDER — OXYCODONE HCL 5 MG PO TABS: ORAL_TABLET | ORAL | 0 refills | Status: DC

## 2016-11-04 MED ORDER — MUPIROCIN 2 % EX OINT
1.0000 "application " | TOPICAL_OINTMENT | Freq: Two times a day (BID) | CUTANEOUS | Status: DC
  Administered 2016-11-04: 1 via NASAL
  Filled 2016-11-04: qty 22

## 2016-11-04 MED ORDER — CHLORHEXIDINE GLUCONATE CLOTH 2 % EX PADS
6.0000 | MEDICATED_PAD | Freq: Every day | CUTANEOUS | Status: DC
  Administered 2016-11-04: 6 via TOPICAL

## 2016-11-04 NOTE — Progress Notes (Addendum)
Patient refusing to use knee immobilizer and bone foam even with strong encouragement and education. Patient stated that she was not wearing the knee immobilizer when ambulating in hall with PT.

## 2016-11-04 NOTE — Progress Notes (Signed)
Physical Therapy Treatment Patient Details Name: Christy Randall MRN: [id number] DOB: Nov 10, 1965 Today's Date: 11/04/2016    History of Present Illness Pt is a 51 yo female with L knee pain secondary to end stage knee OA, s/p L TKA 11/02/16. PMH significant for COPD, OA and anxiety.      PT Comments    Pt to d/c home today.  Performed stair training and reviewed HEP for home use.  Improved ROM noted today.  Plan to return home remains appropriate.  Informed RN patient in ready to d/c.    Follow Up Recommendations  Home health PT     Equipment Recommendations  Rolling walker with 5" wheels    Recommendations for Other Services OT consult     Precautions / Restrictions Precautions Precautions: Knee Precaution Booklet Issued: No Required Braces or Orthoses: Knee Immobilizer - Left Restrictions Weight Bearing Restrictions: Yes LLE Weight Bearing: Weight bearing as tolerated    Mobility  Bed Mobility Overal bed mobility: Needs Assistance Bed Mobility: Sit to Supine     Supine to sit: Min assist Sit to supine: Min assist   General bed mobility comments: Cues for sequencing.  required use of PTA as a rail to elevate into sitting.    Transfers Overall transfer level: Needs assistance Equipment used: Rolling walker (2 wheeled) Transfers: Sit to/from Stand Sit to Stand: Supervision         General transfer comment: supervision for safety, vc for hand placement.    Ambulation/Gait Ambulation/Gait assistance: Supervision Ambulation Distance (Feet): 250 Feet Assistive device: Rolling walker (2 wheeled) Gait Pattern/deviations: Step-through pattern;Antalgic;Trunk flexed;Decreased stance time - left Gait velocity: slowed Gait velocity interpretation: Below normal speed for age/gender General Gait Details: Cues for gait symmetry to avoid antalgic pattern.     Stairs Stairs: Yes   Stair Management: One rail Left;Forwards;Sideways Number of Stairs: 2 General stair  comments: Cues for sequencing and hand placement on railing.  Forward to ascend and sideways to descend.    Wheelchair Mobility    Modified Rankin (Stroke Patients Only)       Balance Overall balance assessment: Needs assistance   Sitting balance-Leahy Scale: Good       Standing balance-Leahy Scale: Good                              Cognition Arousal/Alertness: Awake/alert Behavior During Therapy: WFL for tasks assessed/performed Overall Cognitive Status: Within Functional Limits for tasks assessed                                        Exercises Total Joint Exercises Ankle Circles/Pumps: AROM;Both;20 reps;Supine Quad Sets: AROM;Left;10 reps;Supine Towel Squeeze: AROM;Both;10 reps;Supine Short Arc Quad: AROM;Left;10 reps;Supine Hip ABduction/ADduction: AROM;Left;10 reps;Supine Straight Leg Raises: AROM;Left;10 reps;AAROM;Supine (AAROM intially) Long Arc Quad: AROM;Left;10 reps;Seated Goniometric ROM: 91 degrees flexion in L knee.      General Comments        Pertinent Vitals/Pain Pain Assessment: 0-10 Pain Score: 4  Pain Location: L knee Pain Descriptors / Indicators: Constant;Sharp;Stabbing;Aching;Operative site guarding;Grimacing;Guarding Pain Intervention(s): Monitored during session;Repositioned;Ice applied    Home Living                      Prior Function            PT Goals (current goals can now be found  in the care plan section) Acute Rehab PT Goals Patient Stated Goal: go home soon Potential to Achieve Goals: Good Progress towards PT goals: Progressing toward goals    Frequency    7X/week      PT Plan Current plan remains appropriate    Co-evaluation              AM-PAC PT "6 Clicks" Daily Activity  Outcome Measure  Difficulty turning over in bed (including adjusting bedclothes, sheets and blankets)?: A Lot Difficulty moving from lying on back to sitting on the side of the bed? : A  Lot Difficulty sitting down on and standing up from a chair with arms (e.g., wheelchair, bedside commode, etc,.)?: A Little Help needed moving to and from a bed to chair (including a wheelchair)?: A Little Help needed walking in hospital room?: A Little Help needed climbing 3-5 steps with a railing? : A Little 6 Click Score: 16    End of Session Equipment Utilized During Treatment: Gait belt;Left knee immobilizer Activity Tolerance: Patient limited by pain Patient left: in bed;with call bell/phone within reach;with family/visitor present Nurse Communication: Mobility status;Precautions;Weight bearing status PT Visit Diagnosis: Unsteadiness on feet (R26.81);Other abnormalities of gait and mobility (R26.89);Muscle weakness (generalized) (M62.81);Difficulty in walking, not elsewhere classified (R26.2);Pain Pain - Right/Left: Left Pain - part of body: Knee     Time: 3354-5625 PT Time Calculation (min) (ACUTE ONLY): 24 min  Charges:  $Gait Training: 8-22 mins $Therapeutic Exercise: 8-22 mins                    G Codes:       Governor Rooks, PTA pager [phone]    Cristela Blue 11/04/2016, 10:57 AM

## 2016-11-04 NOTE — Progress Notes (Deleted)
11/04/2016  Sat: Nancy Fetter:  Mon: TuesDelsa Bern Thurs: Fri:  Seen by:cc

## 2016-11-04 NOTE — Discharge Summary (Signed)
Patient ID: Christy Randall MRN: [id number] DOB/AGE: 10-20-1965 51 y.o.  Admit date: 11/02/2016 Discharge date: 11/04/2016  Admission Diagnoses:  Principal Problem:   Primary localized osteoarthrosis of the knee, left Active Problems:   Sleep apnea uses CPAP   Hypothyroid   GERD (gastroesophageal reflux disease)   COPD (chronic obstructive pulmonary disease) (HCC)   Anxiety   Low back pain radiating to left lower extremity   Discharge Diagnoses:  Same  Past Medical History:  Diagnosis Date  . Anxiety   . COPD (chronic obstructive pulmonary disease) ([place])   . Dyspnea   . GERD (gastroesophageal reflux disease)   . Hypothyroid   . Low back pain radiating to left lower extremity 11/03/2016  . Osteoarthritis    left knee  . Primary localized osteoarthrosis of the knee, left   . Sleep apnea uses CPAP     Surgeries: Procedure(s): TOTAL KNEE ARTHROPLASTY on 11/02/2016   Consultants:   Discharged Condition: Improved  Hospital Course: Christy Randall is an 51 y.o. female who was admitted 11/02/2016 for operative treatment ofPrimary localized osteoarthrosis of the knee, left. Patient has severe unremitting pain that affects sleep, daily activities, and work/hobbies. After pre-op clearance the patient was taken to the operating room on 11/02/2016 and underwent  Procedure(s): TOTAL KNEE ARTHROPLASTY.    Patient was given perioperative antibiotics: Anti-infectives    Start     Dose/Rate Route Frequency Ordered Stop   11/03/16 1105  ceFAZolin (ANCEF) IVPB 2g/100 mL premix     2 g 200 mL/hr over 30 Minutes Intravenous Every 8 hours 11/03/16 1105     11/03/16 0800  ceFAZolin (ANCEF) IVPB 2g/100 mL premix  Status:  Discontinued     2 g 200 mL/hr over 30 Minutes Intravenous Every 8 hours 11/03/16 0734 11/03/16 1105   11/02/16 1800  vancomycin (VANCOCIN) IVPB 1000 mg/200 mL premix     1,000 mg 200 mL/hr over 60 Minutes Intravenous Every 12 hours 11/02/16 1103 11/02/16 2038   11/02/16  0800  tobramycin (NEBCIN) powder  Status:  Discontinued       As needed 11/02/16 0800 11/02/16 0920   11/02/16 0600  vancomycin (VANCOCIN) 1,500 mg in sodium chloride 0.9 % 500 mL IVPB     1,500 mg 250 mL/hr over 120 Minutes Intravenous To [facility] 10/30/16 1102 11/02/16 2297       Patient was given sequential compression devices, early ambulation, and chemoprophylaxis to prevent DVT.  Patient benefited maximally from hospital stay and there were no complications.    Recent vital signs: Patient Vitals for the past 24 hrs:  BP Temp Temp src Pulse Resp SpO2  11/04/16 0500 140/63 98.1 F (36.7 C) Oral 79 18 96 %  11/03/16 2122 - - - 68 16 94 %  11/03/16 1951 (!) 153/62 98.6 F (37 C) Oral 67 18 93 %     Recent laboratory studies:  Recent Labs  11/03/16 0428 11/04/16 0331  WBC 17.2* 18.2*  HGB 12.4 11.3*  HCT 38.0 35.1*  PLT 243 255  NA 137 137  K 5.1 4.3  CL 102 100*  CO2 27 27  BUN 10 13  CREATININE 0.91 0.87  GLUCOSE 208* 235*  CALCIUM 9.1 8.5*     Discharge Medications:   Allergies as of 11/04/2016      Reactions   Morphine And Related Other (See Comments)   "Felt like she was going to die & felt like her blood was on fire"  Medication List    STOP taking these medications   HYDROcodone-acetaminophen 5-325 MG tablet Commonly known as:  NORCO/VICODIN   meloxicam 7.5 MG tablet Commonly known as:  MOBIC     TAKE these medications   acetaminophen 325 MG tablet Commonly known as:  TYLENOL Take 2 tablets (650 mg total) by mouth every 6 (six) hours as needed for mild pain (or Fever >/= 101).   aspirin 325 MG EC tablet Take 1 tablet (325 mg total) by mouth daily with breakfast. Start taking on:  11/05/2016   diazepam 2 MG tablet Commonly known as:  VALIUM Take 1 tablet (2 mg total) by mouth every 6 (six) hours as needed for anxiety.   docusate sodium 100 MG capsule Commonly known as:  COLACE 1 tab 2 times a day while on narcotics.  STOOL  SOFTENER   levothyroxine 125 MCG tablet Commonly known as:  SYNTHROID, LEVOTHROID Take 125 mcg by mouth daily before breakfast.   mupirocin ointment 2 % Commonly known as:  BACTROBAN Place 1 application into the nose 2 (two) times daily.   oxyCODONE 5 MG immediate release tablet Commonly known as:  Oxy IR/ROXICODONE 1-2 tablets every 4 hrs as needed for pain   polyethylene glycol packet Commonly known as:  MIRALAX / GLYCOLAX 17grams in 6 oz of water twice a day until bowel movement.  LAXITIVE.  Restart if two days since last bowel movement   predniSONE 5 MG tablet Commonly known as:  DELTASONE Take 5 mg by mouth daily with breakfast.   Vitamin D3 2000 units Tabs Take 2,000 Units by mouth daily.            Durable Medical Equipment        Start     Ordered   11/03/16 1041  For home use only DME Other see comment  Once    Comments:  Shower chair   11/03/16 1040      Diagnostic Studies: No results found.  Disposition: Final discharge disposition not confirmed  Discharge Instructions    CPM    Complete by:  As directed    Continuous passive motion machine (CPM):      Use the CPM from 0 to 90 for 6 hours per day.       You may break it up into 2 or 3 sessions per day.      Use CPM for 2 weeks or until you are told to stop.   Call MD / Call 911    Complete by:  As directed    If you experience chest pain or shortness of breath, CALL 911 and be transported to the hospital emergency room.  If you develope a fever above 101 F, pus (white drainage) or increased drainage or redness at the wound, or calf pain, call your surgeon's office.   Change dressing    Complete by:  As directed    apply TED hose.  DO NOT REMOVE BANDAGE OVER SURGICAL INCISION.  [place] WHOLE LEG INCLUDING OVER THE WATERPROOF BANDAGE WITH SOAP AND WATER EVERY DAY.   Constipation Prevention    Complete by:  As directed    Drink plenty of fluids.  Prune juice may be helpful.  You may use a stool  softener, such as Colace (over the counter) 100 mg twice a day.  Use MiraLax (over the counter) for constipation as needed.   Diet - low sodium heart healthy    Complete by:  As directed    Discharge  instructions    Complete by:  As directed    INSTRUCTIONS AFTER JOINT REPLACEMENT   Remove items at home which could result in a fall. This includes throw rugs or furniture in walking pathways ICE to the affected joint every three hours while awake for 30 minutes at a time, for at least the first 3-5 days, and then as needed for pain and swelling.  Continue to use ice for pain and swelling. You may notice swelling that will progress down to the foot and ankle.  This is normal after surgery.  Elevate your leg when you are not up walking on it.   Continue to use the breathing machine you got in the hospital (incentive spirometer) which will help keep your temperature down.  It is common for your temperature to cycle up and down following surgery, especially at night when you are not up moving around and exerting yourself.  The breathing machine keeps your lungs expanded and your temperature down.   DIET:  As you were doing prior to hospitalization, we recommend a well-balanced diet.  DRESSING / WOUND CARE / SHOWERING  Keep the surgical dressing until follow up.  The dressing is water proof, so you can shower without any extra covering.  IF THE DRESSING FALLS OFF or the wound gets wet inside, change the dressing with sterile gauze.  Please use good hand washing techniques before changing the dressing.  Do not use any lotions or creams on the incision until instructed by your surgeon.    ACTIVITY  Increase activity slowly as tolerated, but follow the weight bearing instructions below.   No driving for 6 weeks or until further direction given by your physician.  You cannot drive while taking narcotics.  No lifting or carrying greater than 10 lbs. until further directed by your surgeon. Avoid periods of  inactivity such as sitting longer than an hour when not asleep. This helps prevent blood clots.  You may return to work once you are authorized by your doctor.     WEIGHT BEARING   Weight bearing as tolerated with assist device (walker, cane, etc) as directed, use it as long as suggested by your surgeon or therapist, typically at least 2-3 weeks.   EXERCISES  Results after joint replacement surgery are often greatly improved when you follow the exercise, range of motion and muscle strengthening exercises prescribed by your doctor. Safety measures are also important to protect the joint from further injury. Any time any of these exercises cause you to have increased pain or swelling, decrease what you are doing until you are comfortable again and then slowly increase them. If you have problems or questions, call your caregiver or physical therapist for advice.   Rehabilitation is important following a joint replacement. After just a few days of immobilization, the muscles of the leg can become weakened and shrink (atrophy).  These exercises are designed to build up the tone and strength of the thigh and leg muscles and to improve motion. Often times heat used for twenty to thirty minutes before working out will loosen up your tissues and help with improving the range of motion but do not use heat for the first two weeks following surgery (sometimes heat can increase post-operative swelling).   These exercises can be done on a training (exercise) mat, on the floor, on a table or on a bed. Use whatever works the best and is most comfortable for you.    Use music or television while you  are exercising so that the exercises are a pleasant break in your day. This will make your life better with the exercises acting as a break in your routine that you can look forward to.   Perform all exercises about fifteen times, three times per day or as directed.  You should exercise both the operative leg and the  other leg as well.   Exercises include:  Quad Sets - Tighten up the muscle on the front of the thigh (Quad) and hold for 5-10 seconds.   Straight Leg Raises - With your knee straight (if you were given a brace, keep it on), lift the leg to 60 degrees, hold for 3 seconds, and slowly lower the leg.  Perform this exercise against resistance later as your leg gets stronger.  Leg Slides: Lying on your back, slowly slide your foot toward your buttocks, bending your knee up off the floor (only go as far as is comfortable). Then slowly slide your foot back down until your leg is flat on the floor again.  Angel Wings: Lying on your back spread your legs to the side as far apart as you can without causing discomfort.  Hamstring Strength:  Lying on your back, push your heel against the floor with your leg straight by tightening up the muscles of your buttocks.  Repeat, but this time bend your knee to a comfortable angle, and push your heel against the floor.  You may put a pillow under the heel to make it more comfortable if necessary.   A rehabilitation program following joint replacement surgery can speed recovery and prevent re-injury in the future due to weakened muscles. Contact your doctor or a physical therapist for more information on knee rehabilitation.    CONSTIPATION  Constipation is defined medically as fewer than three stools per week and severe constipation as less than one stool per week.  Even if you have a regular bowel pattern at home, your normal regimen is likely to be disrupted due to multiple reasons following surgery.  Combination of anesthesia, postoperative narcotics, change in appetite and fluid intake all can affect your bowels.   YOU MUST use at least one of the following options; they are listed in order of increasing strength to get the job done.  They are all available over the counter, and you may need to use some, POSSIBLY even all of these options:    Drink plenty of fluids  (prune juice may be helpful) and high fiber foods Colace 100 mg by mouth twice a day  Senokot for constipation as directed and as needed Dulcolax (bisacodyl), take with full glass of water  Miralax (polyethylene glycol) once or twice a day as needed.  If you have tried all these things and are unable to have a bowel movement in the first 3-4 days after surgery call either your surgeon or your primary doctor.    If you experience loose stools or diarrhea, hold the medications until you stool forms back up.  If your symptoms do not get better within 1 week or if they get worse, check with your doctor.  If you experience "the worst abdominal pain ever" or develop nausea or vomiting, please contact the office immediately for further recommendations for treatment.   ITCHING:  If you experience itching with your medications, try taking only a single pain pill, or even half a pain pill at a time.  You can also use Benadryl over the counter for itching or also to help  with sleep.   TED HOSE STOCKINGS:  Use stockings on both legs until for at least 2 weeks or as directed by physician office. They may be removed at night for sleeping.  MEDICATIONS:  See your medication summary on the "After Visit Summary" that nursing will review with you.  You may have some home medications which will be placed on hold until you complete the course of blood thinner medication.  It is important for you to complete the blood thinner medication as prescribed.  PRECAUTIONS:  If you experience chest pain or shortness of breath - call 911 immediately for transfer to the hospital emergency department.   If you develop a fever greater that 101 F, purulent drainage from wound, increased redness or drainage from wound, foul odor from the wound/dressing, or calf pain - CONTACT YOUR SURGEON.                                                   FOLLOW-UP APPOINTMENTS:  If you do not already have a post-op appointment, please call the  office for an appointment to be seen by your surgeon.  Guidelines for how soon to be seen are listed in your "After Visit Summary", but are typically between 1-4 weeks after surgery.  OTHER INSTRUCTIONS:   Knee Replacement:  Do not place pillow under knee, focus on keeping the knee straight while resting. CPM instructions: 0-90 degrees, 2 hours in the morning, 2 hours in the afternoon, and 2 hours in the evening. Place foam block, curve side up under heel at all times except when in CPM or when walking.  DO NOT modify, tear, cut, or change the foam block in any way.  MAKE SURE YOU:  Understand these instructions.  Get help right away if you are not doing well or get worse.    Thank you for letting us be a part of your medical care team.  It is a privilege we respect greatly.  We hope these instructions will help you stay on track for a fast and full recovery!   Do not put a pillow under the knee. Place it under the heel.    Complete by:  As directed    Place gray foam block, curve side up under heel at all times except when in CPM or when walking.  DO NOT modify, tear, cut, or change in any way the gray foam block.   Increase activity slowly as tolerated    Complete by:  As directed    Patient may shower    Complete by:  As directed    Aquacel dressing is water proof    Wash over it and the whole leg with soap and water at the end of your shower   TED hose    Complete by:  As directed    Use stockings (TED hose) for 2 weeks on both leg(s).  You may remove them at night for sleeping.      Follow-up Information    [facility] Follow up.   Specialty:  [place] Why:  A representative from [facility] will contact you to arrange start date and time for your therapy. Contact information: [address] [place] [address] [phone]        Elsie Saas, MD Follow up on 11/17/2016.  Specialty:  Orthopedic Surgery Why:  appt time 3 pm with Dr Noemi Chapel  and be at Physical therapy at the [facility] office at 1:30 for 2 pm appt to start outpatient physical therapy Contact information: [address] [place] [address] [phone]        [facility] [place] Follow up on 11/17/2016.   Specialty:  Physical Therapy Why:  Arrive at 1:30 for 2 pm physical therapy appt Contact information: [place]  [id number] [phone]            Signed: Linda Hedges 11/04/2016, 8:54 AM

## 2016-11-04 NOTE — Progress Notes (Signed)
PT Cancellation Note  Patient Details Name: HINDY PERRAULT MRN: [id number] DOB: [birth year]   Cancelled Treatment:    Reason Eval/Treat Not Completed: (P) Patient at procedure or test/unavailable (Off unit for chest x-ray will return for PT before d/c home.  )   Cristela Blue 11/04/2016, 9:39 AM  Governor [place]ook[place], PTA pager [phone]

## 2016-11-04 NOTE — Progress Notes (Signed)
Occupational Therapy Treatment Patient Details Name: Christy Randall MRN: [id number] DOB: Apr 11, 1966 Today's Date: 11/04/2016    History of present illness Pt is a 51 yo female with L knee pain secondary to end stage knee OA, s/p L TKA 11/02/16. PMH significant for COPD, OA and anxiety.     OT comments  .Pt progressing towards goals. Provided education on shower transfer with shower seat. Pt demonstrated good understanding and performed transfer with Min guard A for safety. Answered all pt questions. Continue to recommend dc home once medicially stable per physician. All acute OT needs met. Thank you   Follow Up Recommendations  DC plan and follow up therapy as arranged by surgeon;Supervision/Assistance - 24 hour    Equipment Recommendations  3 in 1 bedside commode    Recommendations for Other Services PT consult    Precautions / Restrictions Precautions Precautions: Knee Precaution Booklet Issued: No Required Braces or Orthoses: Knee Immobilizer - Left Restrictions Weight Bearing Restrictions: Yes LLE Weight Bearing: Weight bearing as tolerated       Mobility Bed Mobility Overal bed mobility: Needs Assistance Bed Mobility: Sit to Supine     Supine to sit: Supervision;HOB elevated Sit to supine: Supervision;HOB elevated   General bed mobility comments: no physical A needed  Transfers Overall transfer level: Needs assistance Equipment used: Rolling walker (2 wheeled) Transfers: Sit to/from Stand Sit to Stand: Supervision         General transfer comment: supervision for safety, vc for hand placement.      Balance Overall balance assessment: Needs assistance Sitting-balance support: Feet supported;No upper extremity supported Sitting balance-Leahy Scale: Good     Standing balance support: Bilateral upper extremity supported Standing balance-Leahy Scale: Good Standing balance comment: able to stand without UE support                           ADL  either performed or assessed with clinical judgement   ADL Overall ADL's : Needs assistance/impaired                                 Tub/ Shower Transfer: Walk-in shower;Min guard;Shower seat;Ambulation;Rolling walker;Cueing for sequencing Tub/Shower Transfer Details (indicate cue type and reason): Educated pt on shower transfer. pt demosntrated safe technique Functional mobility during ADLs: Min guard;Rolling walker General ADL Comments: Ptovided all education in preparation for dc home today     Vision       Perception     Praxis      Cognition Arousal/Alertness: Awake/alert Behavior During Therapy: WFL for tasks assessed/performed Overall Cognitive Status: Within Functional Limits for tasks assessed                                          Exercises    Shoulder Instructions       General Comments      Pertinent Vitals/ Pain       Pain Assessment: Faces Faces Pain Scale: Hurts a little bit Pain Location: L knee Pain Descriptors / Indicators: Constant;Sharp;Stabbing;Aching;Operative site guarding;Grimacing;Guarding Pain Intervention(s): Monitored during session  Home Living  Prior Functioning/Environment              Frequency  Min 2X/week        Progress Toward Goals  OT Goals(current goals can now be found in the care plan section)  Progress towards OT goals: Progressing toward goals  Acute Rehab OT Goals Patient Stated Goal: go home soon OT Goal Formulation: With patient Time For Goal Achievement: 11/17/16 Potential to Achieve Goals: Good ADL Goals Pt Will Perform Lower Body Dressing: with set-up;with supervision;with adaptive equipment;sit to/from stand Pt Will Transfer to Toilet: with set-up;with supervision;bedside commode;ambulating Pt Will Perform Tub/Shower Transfer: Shower transfer;with min guard assist;shower seat;ambulating;rolling walker  Plan  Discharge plan remains appropriate    Co-evaluation                 AM-PAC PT "6 Clicks" Daily Activity     Outcome Measure   Help from another person eating meals?: None Help from another person taking care of personal grooming?: A Little Help from another person toileting, which includes using toliet, bedpan, or urinal?: A Little Help from another person bathing (including washing, rinsing, drying)?: A Little Help from another person to put on and taking off regular upper body clothing?: None Help from another person to put on and taking off regular lower body clothing?: A Little 6 Click Score: 20    End of Session Equipment Utilized During Treatment: Rolling walker  OT Visit Diagnosis: Unsteadiness on feet (R26.81);Other abnormalities of gait and mobility (R26.89);Muscle weakness (generalized) (M62.81);Pain Pain - Right/Left: Left Pain - part of body: Knee   Activity Tolerance Patient tolerated treatment well   Patient Left with call bell/phone within reach;in bed   Nurse Communication Mobility status;Patient requests pain meds        Time: 1013-1025 OT Time Calculation (min): 12 min  Charges: OT General Charges $OT Visit: 1 Procedure OT Treatments $Self Care/Home Management : 8-22 mins  [place], OTR/L Acute Rehab Pager: [phone] Office: [place] 11/04/2016, 2:51 PM

## 2016-11-04 NOTE — Progress Notes (Signed)
OT Cancellation   11/04/16 0944  OT Visit Information  Last OT Received On 11/04/16  Assistance Needed +1  Reason Eval/Treat Not Completed Patient at procedure or test/ unavailable (Off unit for chest x-ray will return for PT before d/c home.)    [place], OTR/L Acute Rehab Pager: [phone] Office: [phone]

## 2016-11-04 NOTE — Progress Notes (Signed)
Pt ready for d/c home today per MD. Pt met PT/OT goals, equipment was delivered yesterday. Discharge instructions and prescriptions reviewed with pt and husband, all questions answered.    [place], Jerry Caras

## 2016-11-05 LAB — HEMOGLOBIN A1C
HEMOGLOBIN A1C: 6.6 % — AB (ref 4.8–5.6)
MEAN PLASMA GLUCOSE: 143 mg/dL

## 2017-05-06 ENCOUNTER — Other Ambulatory Visit: Payer: Self-pay | Admitting: Urology

## 2017-05-10 ENCOUNTER — Encounter (HOSPITAL_BASED_OUTPATIENT_CLINIC_OR_DEPARTMENT_OTHER): Payer: Self-pay | Admitting: *Deleted

## 2017-05-10 ENCOUNTER — Other Ambulatory Visit: Payer: Self-pay

## 2017-05-10 NOTE — Progress Notes (Signed)
SPOKE W/ PT VIA PHONE FOR PRE-OP INTERVIEW.  NPO AFTER MN.  ARRIVE AT 2440.  NEEDS HG.  WILL TAKE SYNTHROID AM DOS W/ SIPS OF WATER.

## 2017-05-11 ENCOUNTER — Encounter: Payer: BLUE CROSS/BLUE SHIELD | Attending: Orthopedic Surgery | Admitting: Nutrition

## 2017-05-11 VITALS — Ht 66.0 in | Wt 272.0 lb

## 2017-05-11 DIAGNOSIS — E1165 Type 2 diabetes mellitus with hyperglycemia: Secondary | ICD-10-CM

## 2017-05-11 DIAGNOSIS — E7800 Pure hypercholesterolemia, unspecified: Secondary | ICD-10-CM

## 2017-05-11 DIAGNOSIS — IMO0002 Reserved for concepts with insufficient information to code with codable children: Secondary | ICD-10-CM

## 2017-05-11 DIAGNOSIS — Z713 Dietary counseling and surveillance: Secondary | ICD-10-CM | POA: Insufficient documentation

## 2017-05-11 DIAGNOSIS — E118 Type 2 diabetes mellitus with unspecified complications: Secondary | ICD-10-CM

## 2017-05-11 NOTE — Progress Notes (Signed)
  Medical Nutrition Therapy:  Appt start time: 1330 end time:  1500   Assessment:  Primary concerns today: Obesity, Diabetes Type 2.  A1C 6.6% from hospital labs.Marland Kitchen TCHol Gained 30 lbs in the last year or two. Recently has surgery. 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 Didn't know she had DM Type 2. Labs from Dr. Trena Platt office from 2/18 shows she has hyperlipidemia and didn't know she had it either. TCHOL is 241, mg/dl. TG 238 mg/dl, LDL los 33 mg/dl, LDL 160 mg/dl. She is not on any medications and was not told of these elevated labs. She notes her BP is elevated sometimes. Not on any meds. She has Metabolic syndrome. T4 HIgh 1.93 ng/dl.   Current diet and lifestyle is contributing to her diabetes, obesity, and hyperlipidemia.  Preferred Learning Style:   No preference indicated   Learning Readiness:  Ready  Change in progress   MEDICATIONS:    DIETARY INTAKE:  Eat 2 meals per day. Admits to fast foods, fried foods, not cooking. Drinks sodas, sweet tea and processed foods.  :   Usual physical activity: ADL   Estimated energy needs: 1500  calories 170 g carbohydrates 112 g protein 42 g fat  Progress Towards Goal(s):  In progress.   Nutritional Diagnosis:  NB-1.1 Food and nutrition-related knowledge deficit As related to Diabetes Type 2, Obesity and Hyperlipidemia.  As evidenced by A1C 6.6%, TCHOL 241 and LDL 160 mg/dl and BMI> 30.    Intervention:  Nutrition and Diabetes education provided on My Plate, CHO counting, meal planning, portion sizes, timing of meals, avoiding snacks between meals unless having a low blood sugar, target ranges for A1C and blood sugars, signs/symptoms and treatment of hyper/hypoglycemia, monitoring blood sugars, taking medications as prescribed, benefits of exercising 30 minutes per day and prevention of complications of DM. High Fiber Low fat Low Salt diet..  Goals 1. Follow My Plate 2. Eat three meals per day 3.  Measure foods out 4. Drink water only- 5. Cut out snack and junk food. 6. Exercise 30 minutes 3-4 time per week Talk to PCP about A1C level, cholesterol meds Get a meter and start testing twice a day Lose1-2 lbs per week  Teaching Method Utilized:  Visual Auditory Hands on  Handouts given during visit include:  The Plate Method  Meal Planning  Diabetes Instructions.   Barriers to learning/adherence to lifestyle change: none  Demonstrated degree of understanding via:  Teach Back   Monitoring/Evaluation:  Dietary intake, exercise, meal planning, , and body weight in 1 month(s). Recommend to start metformin, statins, HTN meds and recheck lipid and A1C levels.

## 2017-05-11 NOTE — Patient Instructions (Addendum)
Goals 1. Follow My Plate 2. Eat three meals per day 3. Measure foods out 4. Drink water only- 5. Cut out snack and junk food. 6. Exercise 30 minutes 3-4 time per week Talk to PCP about A1C level, cholesterol meds Get a meter and start testing twice a day Lose1-2 lbs per week

## 2017-05-12 NOTE — H&P (Signed)
CC/HPI: Frequency, Nocturia and Urgency     Christy Randall is a 51 yo female who is sent in consultation by Dr. Melina Schools for urinary frequency. She reports the onset of symptoms about a year. She has nocturia 6-7x. She wakes with urge but has no UUI. She has daytime frequency q22min. She has had no treatment for this condition. She has a good stream without hesitancy. She has no intermittency. She has some mild SUI but doesn't wear pads. She has had no hematuria or dysuria. She can have pain with a very full bladder that is relieved by voiding. She has had no recent UTI's, stones or GU surgery. She is G3P2 with a CS and NVD. She has no prolapse symptoms. She has no GI complaints. She doesn't consume much tea, coffee, soda or artificial sweetners.     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   Tobacco Use Assessment Completed: Used Tobacco in last 30 days? Drinks 2 caffeinated drinks per day. 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     Notes: 2 daughters She has a 35 pack year history. Quit in 12/17.   REVIEW OF SYSTEMS:    GU Review Female:   Patient reports frequent urination, hard to postpone urination, get up at night to urinate, leakage of  urine, and stream starts and stops. Patient denies burning /pain with urination, trouble starting your stream, have to strain to urinate, and being pregnant.  Gastrointestinal (Upper):   Patient reports indigestion/ heartburn. Patient denies nausea and vomiting.  Gastrointestinal (Lower):   Patient denies diarrhea and constipation.  Constitutional:   Patient reports fatigue. Patient denies fever, night sweats, and weight loss.  Skin:   Patient denies itching and skin rash/ lesion.  Eyes:   Patient denies blurred vision and double vision.  Ears/ Nose/ Throat:   Patient denies sore throat and sinus problems.  Hematologic/Lymphatic:   Patient denies swollen glands and easy bruising.  Cardiovascular:   minimal at this time. Patient reports leg swelling. Patient denies chest pains.  Respiratory:   Patient reports shortness of breath. Patient denies cough.  Endocrine:   Patient denies excessive thirst.  Musculoskeletal:   Patient reports back pain and joint pain.   Neurological:   Patient reports headaches. Patient denies dizziness.  Psychologic:   Patient reports depression and anxiety.    VITAL SIGNS:      05/06/2017 09:21 AM  Weight 270 lb / 122.47 kg  Height 66 in / 167.64 cm  BP 145/80 mmHg  Pulse 60 /min  Temperature 98.2 F / 36.7 C  BMI 43.6 kg/m   GU PHYSICAL EXAMINATION:    External Genitalia: No hirsutism, no rash, no scarring, no cyst, no erythematous lesion, no papular lesion, no blanched lesion, no warty lesion. No edema.  Urethral Meatus: Normal size.  Normal position. No discharge.  Urethra: No tenderness, no mass, no scarring. No hypermobility. No leakage.  Bladder: Normal to palpation, no tenderness, no mass, normal size.  Vagina: No atrophy, no stenosis. No rectocele. No cystocele. No enterocele.  Cervix: normal with minimal descent..   Uterus: difficult to palpate because of her body habitus   MULTI-SYSTEM PHYSICAL EXAMINATION:    Constitutional: Obese. No physical  deformities. Normally developed. Good grooming.   Neck: Neck symmetrical, not swollen. Normal tracheal position.  Respiratory: No labored breathing, no use of accessory muscles. CTA  Cardiovascular: Normal temperature, RRR without murmur  Lymphatic: No enlargement, no tenderness of supraclavicular , groin, neck lymph nodes.  Skin: No paleness, no jaundice, no cyanosis. No lesion, no ulcer, no rash.  Neurologic / Psychiatric: Oriented to time, oriented to place, oriented to person. No depression, no anxiety, no agitation.  Gastrointestinal: Obese abdomen. No hernia. No mass, no tenderness, no rigidity.   Musculoskeletal: Normal gait and station of head and neck.     PAST DATA REVIEWED:  Source Of History:  Patient  Records Review:   Previous Doctor Records  Urine Test Review:   Urinalysis  Notes:                     Records from Dr. Rolena Infante reviewed.    PROCEDURES:           PVR Ultrasound - 41937  Scanned Volume: 0 cc         Urinalysis Dipstick Dipstick Cont'd  Color: Yellow Bilirubin: Neg  Appearance: Clear Ketones: Neg  Specific Gravity: 1.020 Blood: Neg  pH: 5.5 Protein: Neg  Glucose: Neg Urobilinogen: 0.2    Nitrites: Neg    Leukocyte Esterase: Neg    ASSESSMENT:      ICD-10 Details  1 GU:   Overactive bladder - N32.81 She has marked frequency and nocturia with urgency but minimal incontinence. She has some pain with a full bladder. I am going to have her review the list of dietary irritants and hydrate with water, but I think, particularly with her smoking history that she needs cystoscopy. I will set her up for cystoscopy with hydrodistention of the bladder to r/o IC or mucosal lesions. I have reviewed the risks of bleeding, infection, bladder injury, voiding difficulty, thrombotic events and anesthetic complications.   2   Stress Incontinence - N39.3 Minimal not requiring pads.    PLAN:           Schedule Return Visit/Planned Activity: Next Available Appointment -  Schedule Surgery          Document Letter(s):  Created for Patient: Clinical Summary

## 2017-05-13 ENCOUNTER — Encounter (HOSPITAL_BASED_OUTPATIENT_CLINIC_OR_DEPARTMENT_OTHER)
Admission: RE | Disposition: A | Discharge status: Home / Self Care | Payer: Self-pay | Source: Ambulatory Visit | Attending: Urology

## 2017-05-13 ENCOUNTER — Ambulatory Visit (HOSPITAL_BASED_OUTPATIENT_CLINIC_OR_DEPARTMENT_OTHER): Payer: BLUE CROSS/BLUE SHIELD | Admitting: Anesthesiology

## 2017-05-13 ENCOUNTER — Ambulatory Visit (HOSPITAL_BASED_OUTPATIENT_CLINIC_OR_DEPARTMENT_OTHER)
Admission: RE | Admit: 2017-05-13 | Discharge: 2017-05-13 | Disposition: A | Discharge status: Home / Self Care | Payer: BLUE CROSS/BLUE SHIELD | Source: Ambulatory Visit | Attending: Urology | Admitting: Urology

## 2017-05-13 ENCOUNTER — Encounter: Payer: Self-pay | Admitting: Nutrition

## 2017-05-13 ENCOUNTER — Encounter (HOSPITAL_BASED_OUTPATIENT_CLINIC_OR_DEPARTMENT_OTHER): Payer: Self-pay

## 2017-05-13 DIAGNOSIS — Z87891 Personal history of nicotine dependence: Secondary | ICD-10-CM | POA: Diagnosis not present

## 2017-05-13 DIAGNOSIS — F329 Major depressive disorder, single episode, unspecified: Secondary | ICD-10-CM | POA: Diagnosis not present

## 2017-05-13 DIAGNOSIS — Z7982 Long term (current) use of aspirin: Secondary | ICD-10-CM | POA: Diagnosis not present

## 2017-05-13 DIAGNOSIS — N393 Stress incontinence (female) (male): Secondary | ICD-10-CM | POA: Insufficient documentation

## 2017-05-13 DIAGNOSIS — Z885 Allergy status to narcotic agent status: Secondary | ICD-10-CM | POA: Insufficient documentation

## 2017-05-13 DIAGNOSIS — N3289 Other specified disorders of bladder: Secondary | ICD-10-CM | POA: Insufficient documentation

## 2017-05-13 DIAGNOSIS — G4730 Sleep apnea, unspecified: Secondary | ICD-10-CM | POA: Insufficient documentation

## 2017-05-13 DIAGNOSIS — I10 Essential (primary) hypertension: Secondary | ICD-10-CM | POA: Insufficient documentation

## 2017-05-13 DIAGNOSIS — J449 Chronic obstructive pulmonary disease, unspecified: Secondary | ICD-10-CM | POA: Insufficient documentation

## 2017-05-13 DIAGNOSIS — G908 Other disorders of autonomic nervous system: Secondary | ICD-10-CM | POA: Insufficient documentation

## 2017-05-13 DIAGNOSIS — E039 Hypothyroidism, unspecified: Secondary | ICD-10-CM | POA: Diagnosis not present

## 2017-05-13 DIAGNOSIS — Z79899 Other long term (current) drug therapy: Secondary | ICD-10-CM | POA: Insufficient documentation

## 2017-05-13 DIAGNOSIS — M1990 Unspecified osteoarthritis, unspecified site: Secondary | ICD-10-CM | POA: Diagnosis not present

## 2017-05-13 DIAGNOSIS — R351 Nocturia: Secondary | ICD-10-CM | POA: Insufficient documentation

## 2017-05-13 DIAGNOSIS — K219 Gastro-esophageal reflux disease without esophagitis: Secondary | ICD-10-CM | POA: Insufficient documentation

## 2017-05-13 DIAGNOSIS — N3281 Overactive bladder: Secondary | ICD-10-CM | POA: Insufficient documentation

## 2017-05-13 DIAGNOSIS — M5137 Other intervertebral disc degeneration, lumbosacral region: Secondary | ICD-10-CM | POA: Insufficient documentation

## 2017-05-13 HISTORY — DX: Obstructive sleep apnea (adult) (pediatric): G47.33

## 2017-05-13 HISTORY — DX: Presence of spectacles and contact lenses: Z97.3

## 2017-05-13 HISTORY — DX: Neuralgia and neuritis, unspecified: M79.2

## 2017-05-13 HISTORY — DX: Dependence on other enabling machines and devices: Z99.89

## 2017-05-13 HISTORY — DX: Spondylosis without myelopathy or radiculopathy, lumbar region: M47.816

## 2017-05-13 HISTORY — PX: CYSTOSCOPY WITH HYDRODISTENSION AND BIOPSY: SHX5127

## 2017-05-13 HISTORY — DX: Frequency of micturition: R35.0

## 2017-05-13 HISTORY — DX: Nocturia: R35.1

## 2017-05-13 HISTORY — DX: Urgency of urination: R39.15

## 2017-05-13 LAB — POCT HEMOGLOBIN-HEMACUE: HEMOGLOBIN: 12.7 g/dL (ref 12.0–15.0)

## 2017-05-13 LAB — GLUCOSE, CAPILLARY: GLUCOSE-CAPILLARY: 112 mg/dL — AB (ref 65–99)

## 2017-05-13 SURGERY — CYSTOSCOPY, WITH BLADDER HYDRODISTENSION AND BIOPSY
Anesthesia: General

## 2017-05-13 MED ORDER — FENTANYL CITRATE (PF) 100 MCG/2ML IJ SOLN
INTRAMUSCULAR | Status: DC | PRN
  Administered 2017-05-13: 25 ug via INTRAVENOUS

## 2017-05-13 MED ORDER — HYDROCODONE-ACETAMINOPHEN 5-325 MG PO TABS: 1.0000 | ORAL_TABLET | Freq: Four times a day (QID) | ORAL | 0 refills | Status: DC | PRN

## 2017-05-13 MED ORDER — KETOROLAC TROMETHAMINE 30 MG/ML IJ SOLN
INTRAMUSCULAR | Status: AC
  Filled 2017-05-13: qty 1

## 2017-05-13 MED ORDER — ACETAMINOPHEN 325 MG PO TABS
650.0000 mg | ORAL_TABLET | ORAL | Status: DC | PRN
Start: 2017-05-13 — End: 2017-05-13
  Filled 2017-05-13: qty 2

## 2017-05-13 MED ORDER — DEXAMETHASONE SODIUM PHOSPHATE 10 MG/ML IJ SOLN
INTRAMUSCULAR | Status: DC | PRN
  Administered 2017-05-13: 10 mg via INTRAVENOUS

## 2017-05-13 MED ORDER — DEXAMETHASONE SODIUM PHOSPHATE 10 MG/ML IJ SOLN
INTRAMUSCULAR | Status: AC
  Filled 2017-05-13: qty 1

## 2017-05-13 MED ORDER — MIDAZOLAM HCL 5 MG/5ML IJ SOLN
INTRAMUSCULAR | Status: DC | PRN
  Administered 2017-05-13: 2 mg via INTRAVENOUS

## 2017-05-13 MED ORDER — CEFAZOLIN SODIUM-DEXTROSE 2-4 GM/100ML-% IV SOLN
INTRAVENOUS | Status: AC
  Filled 2017-05-13: qty 100

## 2017-05-13 MED ORDER — ACETAMINOPHEN 650 MG RE SUPP
650.0000 mg | RECTAL | Status: DC | PRN
Start: 2017-05-13 — End: 2017-05-13
  Filled 2017-05-13: qty 1

## 2017-05-13 MED ORDER — FENTANYL CITRATE (PF) 100 MCG/2ML IJ SOLN
25.0000 ug | INTRAMUSCULAR | Status: DC | PRN
  Filled 2017-05-13: qty 1

## 2017-05-13 MED ORDER — OXYCODONE HCL 5 MG PO TABS
5.0000 mg | ORAL_TABLET | ORAL | Status: DC | PRN
  Filled 2017-05-13: qty 2

## 2017-05-13 MED ORDER — MIDAZOLAM HCL 2 MG/2ML IJ SOLN
INTRAMUSCULAR | Status: AC
  Filled 2017-05-13: qty 2

## 2017-05-13 MED ORDER — PROPOFOL 10 MG/ML IV BOLUS
INTRAVENOUS | Status: AC
  Filled 2017-05-13: qty 40

## 2017-05-13 MED ORDER — LACTATED RINGERS IV SOLN
INTRAVENOUS | Status: DC
  Administered 2017-05-13: 1000 mL via INTRAVENOUS
  Filled 2017-05-13: qty 1000

## 2017-05-13 MED ORDER — STERILE WATER FOR IRRIGATION IR SOLN
Status: DC | PRN
  Administered 2017-05-13: 3000 mL

## 2017-05-13 MED ORDER — BUPIVACAINE HCL (PF) 0.5 % IJ SOLN
INTRAMUSCULAR | Status: DC | PRN
  Administered 2017-05-13: 15 mL via INTRAVESICAL

## 2017-05-13 MED ORDER — SODIUM CHLORIDE 0.9% FLUSH
3.0000 mL | Freq: Two times a day (BID) | INTRAVENOUS | Status: DC
  Filled 2017-05-13: qty 3

## 2017-05-13 MED ORDER — KETOROLAC TROMETHAMINE 30 MG/ML IJ SOLN
INTRAMUSCULAR | Status: DC | PRN
  Administered 2017-05-13: 30 mg via INTRAVENOUS

## 2017-05-13 MED ORDER — SODIUM CHLORIDE 0.9 % IV SOLN
250.0000 mL | INTRAVENOUS | Status: DC | PRN
  Filled 2017-05-13: qty 250

## 2017-05-13 MED ORDER — SODIUM CHLORIDE 0.9% FLUSH
3.0000 mL | INTRAVENOUS | Status: DC | PRN
  Filled 2017-05-13: qty 3

## 2017-05-13 MED ORDER — PHENAZOPYRIDINE HCL 200 MG PO TABS: 200.0000 mg | ORAL_TABLET | Freq: Three times a day (TID) | ORAL | 0 refills | Status: DC | PRN

## 2017-05-13 MED ORDER — PROPOFOL 10 MG/ML IV BOLUS
INTRAVENOUS | Status: DC | PRN
  Administered 2017-05-13: 250 mg via INTRAVENOUS

## 2017-05-13 MED ORDER — FENTANYL CITRATE (PF) 100 MCG/2ML IJ SOLN
INTRAMUSCULAR | Status: AC
  Filled 2017-05-13: qty 2

## 2017-05-13 MED ORDER — LIDOCAINE 2% (20 MG/ML) 5 ML SYRINGE
INTRAMUSCULAR | Status: DC | PRN
  Administered 2017-05-13: 100 mg via INTRAVENOUS

## 2017-05-13 MED ORDER — ONDANSETRON HCL 4 MG/2ML IJ SOLN
INTRAMUSCULAR | Status: AC
  Filled 2017-05-13: qty 2

## 2017-05-13 MED ORDER — LIDOCAINE 2% (20 MG/ML) 5 ML SYRINGE
INTRAMUSCULAR | Status: AC
  Filled 2017-05-13: qty 5

## 2017-05-13 MED ORDER — ONDANSETRON HCL 4 MG/2ML IJ SOLN
INTRAMUSCULAR | Status: DC | PRN
  Administered 2017-05-13: 4 mg via INTRAVENOUS

## 2017-05-13 MED ORDER — CEFAZOLIN SODIUM-DEXTROSE 2-4 GM/100ML-% IV SOLN
2.0000 g | INTRAVENOUS | Status: AC
  Administered 2017-05-13: 2 g via INTRAVENOUS
  Filled 2017-05-13: qty 100

## 2017-05-13 SURGICAL SUPPLY — 19 items
BAG DRAIN URO-CYSTO SKYTR STRL (DRAIN) ×3 IMPLANT
CATH FOLEY 2WAY SLVR  5CC 16FR (CATHETERS) ×2
CATH FOLEY 2WAY SLVR 5CC 16FR (CATHETERS) ×1 IMPLANT
CLOTH BEACON ORANGE TIMEOUT ST (SAFETY) ×3 IMPLANT
ELECT REM PT RETURN 9FT ADLT (ELECTROSURGICAL) ×3
ELECTRODE REM PT RTRN 9FT ADLT (ELECTROSURGICAL) ×1 IMPLANT
GLOVE SURG SS PI 8.0 STRL IVOR (GLOVE) ×3 IMPLANT
GOWN STRL REUS W/ TWL XL LVL3 (GOWN DISPOSABLE) ×1 IMPLANT
GOWN STRL REUS W/TWL XL LVL3 (GOWN DISPOSABLE) ×2
KIT RM TURNOVER CYSTO AR (KITS) ×3 IMPLANT
MANIFOLD NEPTUNE II (INSTRUMENTS) IMPLANT
NDL SAFETY ECLIPSE 18X1.5 (NEEDLE) ×1 IMPLANT
NEEDLE HYPO 18GX1.5 SHARP (NEEDLE) ×2
NS IRRIG 500ML POUR BTL (IV SOLUTION) IMPLANT
PACK CYSTO (CUSTOM PROCEDURE TRAY) ×3 IMPLANT
SYR 30ML LL (SYRINGE) ×3 IMPLANT
TUBE CONNECTING 12'X1/4 (SUCTIONS)
TUBE CONNECTING 12X1/4 (SUCTIONS) IMPLANT
WATER STERILE IRR 3000ML UROMA (IV SOLUTION) ×3 IMPLANT

## 2017-05-13 NOTE — Anesthesia Postprocedure Evaluation (Signed)
Anesthesia Post Note  Patient: Christy Randall  Procedure(s) Performed: Orlie Dakin INSTILL MARCAINE AND PYRIDIUM (N/A )     Patient location during evaluation: PACU Anesthesia Type: General Level of consciousness: awake Pain management: pain level controlled Vital Signs Assessment: post-procedure vital signs reviewed and stable Respiratory status: spontaneous breathing Cardiovascular status: stable Anesthetic complications: no    Last Vitals:  Vitals:   05/13/17 0945 05/13/17 1028  BP: (!) 148/68 (!) 149/83  Pulse: (!) 58 (!) 58  Resp: 14 20  Temp:  37.5 C  SpO2: (!) 89% 100%    Last Pain:  Vitals:   05/13/17 0636  TempSrc: Oral                 Seena Ritacco

## 2017-05-13 NOTE — Anesthesia Preprocedure Evaluation (Signed)
Anesthesia Evaluation  Patient identified by MRN, date of birth, ID band Patient awake    Reviewed: Allergy & Precautions, NPO status , Patient's Chart, lab work & pertinent test results  Airway Mallampati: II  TM Distance: >3 FB     Dental   Pulmonary sleep apnea , COPD, former smoker,    breath sounds clear to auscultation       Cardiovascular negative cardio ROS   Rhythm:Regular Rate:Normal     Neuro/Psych    GI/Hepatic Neg liver ROS, GERD  ,  Endo/Other  Hypothyroidism   Renal/GU negative Renal ROS     Musculoskeletal  (+) Arthritis ,   Abdominal   Peds  Hematology   Anesthesia Other Findings   Reproductive/Obstetrics                             Anesthesia Physical Anesthesia Plan  ASA: III  Anesthesia Plan: General   Post-op Pain Management:    Induction: Intravenous  PONV Risk Score and Plan: 3 and Ondansetron, Dexamethasone, Midazolam and Treatment may vary due to age or medical condition  Airway Management Planned: LMA  Additional Equipment:   Intra-op Plan:   Post-operative Plan: Extubation in OR  Informed Consent: I have reviewed the patients History and Physical, chart, labs and discussed the procedure including the risks, benefits and alternatives for the proposed anesthesia with the patient or authorized representative who has indicated his/her understanding and acceptance.   Dental advisory given  Plan Discussed with: CRNA and Anesthesiologist  Anesthesia Plan Comments:         Anesthesia Quick Evaluation

## 2017-05-13 NOTE — Anesthesia Procedure Notes (Signed)
Procedure Name: LMA Insertion Date/Time: 05/13/2017 8:45 AM Performed by: Bonney Aid, CRNA Pre-anesthesia Checklist: Patient identified, Emergency Drugs available, Suction available and Patient being monitored Patient Re-evaluated:Patient Re-evaluated prior to induction Oxygen Delivery Method: Circle system utilized Preoxygenation: Pre-oxygenation with 100% oxygen Induction Type: IV induction Ventilation: Mask ventilation without difficulty LMA: LMA inserted LMA Size: 4.0 Number of attempts: 1 Airway Equipment and Method: Bite block Placement Confirmation: positive ETCO2 Tube secured with: Tape Dental Injury: Teeth and Oropharynx as per pre-operative assessment

## 2017-05-13 NOTE — Discharge Instructions (Addendum)
CYSTOSCOPY HOME CARE INSTRUCTIONS  Activity: Rest for the remainder of the day.  Do not drive or operate equipment today.  You may resume normal activities in one to two days as instructed by your physician.   Meals: Drink plenty of liquids and eat light foods such as gelatin or soup this evening.  You may return to a normal meal plan tomorrow.  Return to Work: You may return to work in one to two days or as instructed by your physician.  Special Instructions / Symptoms: Call your physician if any of these symptoms occur:   -persistent or heavy bleeding  -bleeding which continues after first few urination  -large blood clots that are difficult to pass  -urine stream diminishes or stops completely  -fever equal to or higher than 101 degrees Farenheit.  -cloudy urine with a strong, foul odor  -severe pain  Females should always wipe from front to back after elimination.  You may feel some burning pain when you urinate.  This should disappear with time.  Applying moist heat to the lower abdomen or a hot tub bath may help relieve the pain. \   NO ADVIL, ALEVE, MOTRIN, IBUPROFEN UNTIL 3 PM TODAY   Post Anesthesia Home Care Instructions  Activity: Get plenty of rest for the remainder of the day. A responsible individual must stay with you for 24 hours following the procedure.  For the next 24 hours, DO NOT: -Drive a car -Paediatric nurse -Drink alcoholic beverages -Take any medication unless instructed by your physician -Make any legal decisions or sign important papers.  Meals: Start with liquid foods such as gelatin or soup. Progress to regular foods as tolerated. Avoid greasy, spicy, heavy foods. If nausea and/or vomiting occur, drink only clear liquids until the nausea and/or vomiting subsides. Call your physician if vomiting continues.  Special Instructions/Symptoms: Your throat may feel dry or sore from the anesthesia or the breathing tube placed in your throat during  surgery. If this causes discomfort, gargle with warm salt water. The discomfort should disappear within 24 hours.  If you had a scopolamine patch placed behind your ear for the management of post- operative nausea and/or vomiting:  1. The medication in the patch is effective for 72 hours, after which it should be removed.  Wrap patch in a tissue and discard in the trash. Wash hands thoroughly with soap and water. 2. You may remove the patch earlier than 72 hours if you experience unpleasant side effects which may include dry mouth, dizziness or visual disturbances. 3. Avoid touching the patch. Wash your hands with soap and water after contact with the patch.

## 2017-05-13 NOTE — Transfer of Care (Signed)
Immediate Anesthesia Transfer of Care Note  Patient: Christy Randall  Procedure(s) Performed: Orlie Dakin INSTILL MARCAINE AND PYRIDIUM (N/A )  Patient Location: PACU  Anesthesia Type:General  Level of Consciousness: awake, alert  and oriented  Airway & Oxygen Therapy: Patient Spontanous Breathing and Patient connected to nasal cannula oxygen  Post-op Assessment: Report given to RN  Post vital signs: Reviewed and stable  Last Vitals: 15078, 66, 13, 93% Vitals:   05/13/17 0636  BP: (!) 178/81  Pulse: (!) 57  Resp: 18  Temp: 36.6 C  SpO2: 97%    Last Pain:  Vitals:   05/13/17 0636  TempSrc: Oral      Patients Stated Pain Goal: 6 (03/27/14 9458)  Complications: No apparent anesthesia complications

## 2017-05-13 NOTE — Interval H&P Note (Signed)
History and Physical Interval Note:  05/13/2017 7:23 AM  Christy Randall  has presented today for surgery, with the diagnosis of [place]  The various methods of treatment have been discussed with the patient and family. After consideration of risks, benefits and other options for treatment, the patient has consented to  Procedure(s): CYSTOSCOPY /HYDRODISTENSION INSTILL MARCAINE AND PYRIDIUM (N/A) as a surgical intervention .  The patient's history has been reviewed, patient examined, no change in status, stable for surgery.  I have reviewed the patient's chart and labs.  Questions were answered to the patient's satisfaction.     Irine Seal

## 2017-05-13 NOTE — Op Note (Signed)
Procedure: Cystoscopy with hydrodistention of the bladder and instillation the of Pyridium and Marcaine.  Preop diagnosis: Urinary frequency with possible interstitial cystitis.  Postop diagnosis: Urinary frequency with possible interstitial cystitis.  Surgeon: Dr. Irine Seal.  Anesthesia: General.  Drain: None.  Specimen: None.  EBL: None.  Complications: None.  Indications: Christy Randall is a 51 year old white female who has marked urinary frequency urgency and nocturia.  She has bladder pain with a very full bladder.  She is to undergo cystoscopy to evaluate for interstitial cystitis.  Procedure: She was taken to the operating room where general anesthetic was induced and she was given antibiotic.  She was placed in lithotomy position and was fitted with PAS hose.  Her perineum and genitalia were prepped with Betadine solution and she was draped in usual sterile fashion.  Cystoscopy was performed using the 30 degree lens with a 23 [place] scope.  Inspection revealed a normal urethra.  The bladder mucosa was pale without tumor stones or inflammation but there was some squamous metaplasia of the trigone.  There was mild trabeculation.  Ureteral orifices were in their normal anatomic position effluxing clear urine.  After initial inspection the bladder was filled to capacity under 80 cm of water pressure and this was held for 2 minutes.  The bladder was then drained.  Her capacity under anesthesia was 500 mL.  She was then re-dilated and drained and the capacity remained approximately 500-550 mL.  Recent repeat inspection of the bladder revealed minimal glomerulations adjacent to the ureteral orificies but no Hunner's ulcers or bladder mucosal cracks were noted.  The cystoscope was removed and a 16 French Foley catheter was inserted.  The bladder was drained.  She was then instilled with 15 mL's of 0.5% Marcaine with 400 mg of crushed Pyridium.  The catheter was removed.  She was taken down from  the lithotomy position.  Her anesthetic was reversed and she was moved to recovery room in stable condition.  There were no complications.

## 2017-05-14 ENCOUNTER — Encounter (HOSPITAL_BASED_OUTPATIENT_CLINIC_OR_DEPARTMENT_OTHER): Payer: Self-pay | Admitting: Urology

## 2017-05-28 ENCOUNTER — Ambulatory Visit (INDEPENDENT_AMBULATORY_CARE_PROVIDER_SITE_OTHER): Payer: BLUE CROSS/BLUE SHIELD | Admitting: Urology

## 2017-05-28 DIAGNOSIS — N3281 Overactive bladder: Secondary | ICD-10-CM | POA: Diagnosis not present

## 2017-05-28 DIAGNOSIS — N3010 Interstitial cystitis (chronic) without hematuria: Secondary | ICD-10-CM

## 2017-07-07 ENCOUNTER — Telehealth: Payer: Self-pay | Admitting: Nutrition

## 2017-07-07 ENCOUNTER — Ambulatory Visit: Payer: BLUE CROSS/BLUE SHIELD | Admitting: Nutrition

## 2017-07-07 NOTE — Telephone Encounter (Signed)
Talked to pt. She had a death in family. Will call back next week to reschedule appt.

## 2018-02-11 ENCOUNTER — Encounter: Payer: Self-pay | Admitting: Podiatry

## 2018-02-11 ENCOUNTER — Ambulatory Visit (INDEPENDENT_AMBULATORY_CARE_PROVIDER_SITE_OTHER): Payer: Self-pay | Admitting: Podiatry

## 2018-02-11 DIAGNOSIS — G629 Polyneuropathy, unspecified: Secondary | ICD-10-CM

## 2018-02-11 DIAGNOSIS — R0989 Other specified symptoms and signs involving the circulatory and respiratory systems: Secondary | ICD-10-CM

## 2018-02-11 NOTE — Progress Notes (Signed)
Subjective:    Patient ID: Christy Randall, female    DOB: 01-16-1966, 52 y.o.   MRN: [id number]  HPI 52 year old female presents the office today for concerns of burning to both of her feet.  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  She feels that when she walks on a more cushion type pad she does much better.  She is currently taking gabapentin.  She denies any claudication symptoms.  No ulceration.   Review of Systems  All other systems reviewed and are negative.  Past Medical History:  Diagnosis Date  . Anxiety   . COPD (chronic obstructive pulmonary disease) ([place])   . Degenerative arthritis of lumbar spine   . Frequent urination   . Hypothyroid   . Nerve pain    bilateral feet  . Nocturia   . OSA on CPAP   . Osteoarthritis   . Urgency of urination   . Wears glasses     Past Surgical History:  Procedure Laterality Date  . CESAREAN SECTION  1992  . COLONOSCOPY    . CYSTOSCOPY WITH HYDRODISTENSION AND BIOPSY N/A 05/13/2017   Procedure: CYSTOSCOPY Adriana Mccallum INSTILL MARCAINE AND PYRIDIUM;  Surgeon: Irine Seal, MD;  Location: [facility];  Service: Urology;  Laterality: N/A;  . [place]  . TOTAL KNEE ARTHROPLASTY Left 11/02/2016   Procedure: TOTAL KNEE ARTHROPLASTY;  Surgeon: Elsie Saas, MD;  Location: [facility];  Service: Orthopedics;  Laterality: Left;  . TRANSTHORACIC ECHOCARDIOGRAM  10/01/2016   ef 50-35%, grade 1 diastolic dysfunction/  trivial MR and TR  . TUBAL LIGATION Bilateral 1995     Current Outpatient Medications:  .  NAPROXEN PO, Take by mouth., Disp: , Rfl:  .  traMADol (ULTRAM) 50 MG tablet, Take by mouth every 6 (six) hours as needed., Disp: , Rfl:  .  Cholecalciferol (VITAMIN D3) 2000 units TABS, Take 2,000 Units by mouth daily., Disp: ,  Rfl:  .  diazepam (VALIUM) 2 MG tablet, Take 1 tablet (2 mg total) by mouth every 6 (six) hours as needed for anxiety., Disp: 30 tablet, Rfl: 0 .  gabapentin (NEURONTIN) 300 MG capsule, gabapentin 300 mg capsule, Disp: , Rfl:  .  levothyroxine (SYNTHROID, LEVOTHROID) 125 MCG tablet, Take 125 mcg by mouth daily before breakfast., Disp: , Rfl:  .  metFORMIN (GLUCOPHAGE) 500 MG tablet, metformin 500 mg tablet, Disp: , Rfl:  .  PARoxetine (PAXIL) 10 MG tablet, , Disp: , Rfl: 1  Allergies  Allergen Reactions  . Morphine And Related Other (See Comments)    "Felt like she was going to die & felt like her blood was on fire"        Objective:   Physical Exam  General: AAO x3, NAD  Dermatological: Skin is warm, dry and supple bilateral. Nails x 10 are well manicured; remaining integument appears unremarkable at this time. There are no open sores, no preulcerative lesions, no rash or signs of infection present.  Vascular: DP pulses 2/4, PT pulse 1/4 there is mild chronic ankle edema present bilaterally.  Neruologic: Sensation decreased with Derrel Nip monofilament as well as decreased vibratory sensation.  Musculoskeletal: There is no area of tenderness identified to bilateral lower extremities there is no overlying edema, erythema otherwise except for noted above.  Muscular strength 5/5  in all groups tested bilateral.  Gait: Unassisted, Nonantalgic.     Assessment & Plan:  52 year old female symptomatic neuropathy -Treatment options discussed including all alternatives, risks, and complications -Etiology of symptoms were discussed -Ice, continue gabapentin.  We also discussed compound cream for neuropathy.  We will try to contact [facility] at her request to see if they can do the compounding otherwise I will order this through [facility].  -I did dispense some generic cushion type inserts to her today and upon her leaving the office she states that he felt much better. -Discussed  other treatment options including neurogenix.  -ABIs performed in the office to ensure adequate circulation which was normal.  Trula Slade DPM

## 2018-02-14 ENCOUNTER — Telehealth: Payer: Self-pay | Admitting: Podiatry

## 2018-02-14 NOTE — Telephone Encounter (Signed)
This is Leonia Corona, calling from [facility]. I'm the compounding technician here. I spoke with Lattie Haw earlier about Ms. Boylen. You guys had faxed over a piece of paperwork that had peripheral neuropathy cream. I called the pt and left her a message as well to give me a call back because we will need to authorize pricing with her before we are able to order the ingredients to make this. If someone could give me a call back because I really do not have a true prescription for this. I need how she is to apply it as well as the doctor's name. Right now, what I've got it in for is pricing it for 100 grams. So if somebody could please call me back at [phone] and I'm not sure the pt is really going to be able to afford this because the price for 100 grams is around $125. Thank you.

## 2018-02-14 NOTE — Telephone Encounter (Signed)
Lattie Haw- she can apply this 3 times a day as needed. We can call [facility] to see pricing.

## 2018-02-16 ENCOUNTER — Telehealth: Payer: Self-pay | Admitting: Podiatry

## 2018-02-16 NOTE — Telephone Encounter (Signed)
Sent [facility] a pharmacy form yesterday and stated that the patient was self pay. Christy Randall

## 2018-02-16 NOTE — Telephone Encounter (Signed)
Pt was calling in stating that Dr. Jacqualyn Posey was suppose to call her PCP-Dr Brigitte Pulse and let him know to increase her pain medication. Also the cream that he suggested to her is to expensive. Could you please give her a call.

## 2018-02-17 NOTE — Telephone Encounter (Signed)
Message sent to Dr. Brigitte Pulse

## 2018-05-06 ENCOUNTER — Encounter

## 2018-05-06 ENCOUNTER — Ambulatory Visit: Payer: BLUE CROSS/BLUE SHIELD | Admitting: Podiatry

## 2018-08-15 ENCOUNTER — Ambulatory Visit: Payer: BLUE CROSS/BLUE SHIELD

## 2019-02-06 ENCOUNTER — Encounter (HOSPITAL_COMMUNITY): Payer: Self-pay | Admitting: *Deleted

## 2019-02-06 ENCOUNTER — Other Ambulatory Visit (HOSPITAL_COMMUNITY): Payer: Self-pay | Admitting: *Deleted

## 2019-02-06 DIAGNOSIS — Z87891 Personal history of nicotine dependence: Secondary | ICD-10-CM

## 2019-02-06 DIAGNOSIS — Z122 Encounter for screening for malignant neoplasm of respiratory organs: Secondary | ICD-10-CM

## 2019-02-06 NOTE — Progress Notes (Signed)
Received self referral for initial lung cancer screening scan.  Contacted patient and obtained smoking history (started age 53, current smoker, quit for about 7 years, 33 pack year) as well as answering questions related to the screening process.  Patient denies signs/symptoms of lung cancer such as weight loss or hemoptysis.  Patient denies comorbidity that would prevent curative treatment if lung cancer were to be found.  Patient is scheduled for shared decision making visit and CT scan on 02/07/2019 at 1030.

## 2019-02-06 NOTE — Progress Notes (Signed)
l °

## 2019-02-07 ENCOUNTER — Ambulatory Visit (HOSPITAL_COMMUNITY)
Admission: RE | Admit: 2019-02-07 | Discharge: 2019-02-07 | Disposition: A | Discharge status: Home / Self Care | Payer: Self-pay | Source: Ambulatory Visit | Attending: Hematology | Admitting: Hematology

## 2019-02-07 ENCOUNTER — Inpatient Hospital Stay (HOSPITAL_COMMUNITY): Payer: Self-pay | Attending: Hematology | Admitting: Hematology

## 2019-02-07 ENCOUNTER — Other Ambulatory Visit: Payer: Self-pay

## 2019-02-07 DIAGNOSIS — Z122 Encounter for screening for malignant neoplasm of respiratory organs: Secondary | ICD-10-CM | POA: Insufficient documentation

## 2019-02-07 DIAGNOSIS — Z87891 Personal history of nicotine dependence: Secondary | ICD-10-CM | POA: Insufficient documentation

## 2019-02-07 DIAGNOSIS — Z720 Tobacco use: Secondary | ICD-10-CM

## 2019-02-07 NOTE — Progress Notes (Signed)
[facility] Initial Visit:  Patient Care Team: Monico Blitz, MD as PCP - General (Internal Medicine)  CHIEF COMPLAINTS/PURPOSE OF CONSULTATION:  - Shared Decision-Making Visit for Lung Cancer Screening  HISTORY OF PRESENTING ILLNESS: Christy Randall 53 y.o. female  presents today for a shared decision making visit for lung cancer screening.  Patient states she started smoking approximately at age 56.  On average patient smokes 1 pack of cigarettes per day.  Patient has discontinued smoking at times but seems to always resume.  She reports that she does have a chronic cough that is nonproductive.  Has any hemoptysis.  She reports a moderate fatigue.  She reports shortness of breath on exertion that does resolve with rest.  She denies any chest pain, lightheadedness or dizziness.  No change in bowel habits.  Appetite is stable.  No weight loss.  She has a family history positive for esophageal cancer in her father.  Review of Systems  Constitutional: Positive for fatigue.  HENT:  Negative.   Eyes: Negative.   Respiratory: Positive for cough.   Cardiovascular: Negative.   Gastrointestinal: Negative.   Endocrine: Negative.   Genitourinary: Negative.    Musculoskeletal: Positive for arthralgias and myalgias.  Skin: Negative.   Neurological: Negative.   Hematological: Negative.   Psychiatric/Behavioral: Negative.     MEDICAL HISTORY: Past Medical History:  Diagnosis Date  . Anxiety   . COPD (chronic obstructive pulmonary disease) ([place])   . Degenerative arthritis of lumbar spine   . Frequent urination   . Hypothyroid   . Nerve pain    bilateral feet  . Nocturia   . OSA on CPAP   . Osteoarthritis   . Urgency of urination   . Wears glasses     SURGICAL HISTORY: Past Surgical History:  Procedure Laterality Date  . CESAREAN SECTION  1992  . COLONOSCOPY    . CYSTOSCOPY WITH HYDRODISTENSION AND BIOPSY N/A 05/13/2017   Procedure: CYSTOSCOPY Adriana Mccallum  INSTILL MARCAINE AND PYRIDIUM;  Surgeon: Irine Seal, MD;  Location: [facility];  Service: Urology;  Laterality: N/A;  . [place]  . TOTAL KNEE ARTHROPLASTY Left 11/02/2016   Procedure: TOTAL KNEE ARTHROPLASTY;  Surgeon: Elsie Saas, MD;  Location: [facility];  Service: Orthopedics;  Laterality: Left;  . TRANSTHORACIC ECHOCARDIOGRAM  10/01/2016   ef [id number], grade 1 diastolic dysfunction/  trivial MR and TR  . TUBAL LIGATION Bilateral 1995    SOCIAL HISTORY: Social History   Socioeconomic History  . Marital status: Married    Spouse name: Not on file  . Number of children: Not on file  . Years of education: Not on file  . Highest education level: Not on file  Occupational History  . Not on file  Social Needs  . Financial resource strain: Not on file  . Food insecurity    Worry: Not on file    Inability: Not on file  . Transportation needs    Medical: Not on file    Non-medical: Not on file  Tobacco Use  . Smoking status: Former Smoker    Packs/day: 1.50    Years: 37.00    Pack years: 55.50    Types: Cigarettes    Start date: 11/19/1986    Quit date: 05/24/2016    Years since quitting: 2.7  . Smokeless tobacco: Never Used  Substance and Sexual Activity  . Alcohol use: No  . Drug use: No  . Sexual activity: Not on file  Lifestyle  . Physical activity    Days per week: Not on file    Minutes per session: Not on file  . Stress: Not on file  Relationships  . Social Herbalist on phone: Not on file    Gets together: Not on file    Attends religious service: Not on file    Active member of club or organization: Not on file    Attends meetings of clubs or organizations: Not on file    Relationship status: Not on file  . Intimate partner violence    Fear of current or ex partner: Not on file    Emotionally abused: Not on file    Physically abused: Not on file    Forced sexual activity: Not on file  Other Topics Concern   . Not on file  Social History Narrative  . Not on file    FAMILY HISTORY Family History  Problem Relation Age of Onset  . Diabetes Mother     ALLERGIES:  is allergic to morphine and related.  MEDICATIONS:  Current Outpatient Medications  Medication Sig Dispense Refill  . Cholecalciferol (VITAMIN D3) 2000 units TABS Take 2,000 Units by mouth daily.    . diazepam (VALIUM) 2 MG tablet Take 1 tablet (2 mg total) by mouth every 6 (six) hours as needed for anxiety. 30 tablet 0  . gabapentin (NEURONTIN) 300 MG capsule gabapentin 300 mg capsule    . levothyroxine (SYNTHROID, LEVOTHROID) 125 MCG tablet Take 125 mcg by mouth daily before breakfast.    . metFORMIN (GLUCOPHAGE) 500 MG tablet metformin 500 mg tablet    . NAPROXEN PO Take by mouth.    [facility] Scarlet FOR[facility]  Peripheral Neuropathy Cream- Bupivacaine 1%, Doxepin 3%, Gabapentin 6%, Pentoxifylline 3%, Topiramate 1% Apply 1-2 grams to affected area 3-4 times daily Qty. 120 gm 3 refills    . PARoxetine (PAXIL) 10 MG tablet   1  . traMADol (ULTRAM) 50 MG tablet Take by mouth every 6 (six) hours as needed.     No current facility-administered medications for this visit.     PHYSICAL EXAMINATION:  ECOG PERFORMANCE STATUS: 1 - Symptomatic but completely ambulatory   There were no vitals filed for this visit.  There were no vitals filed for this visit.   Physical Exam Constitutional:      Appearance: Normal appearance. She is obese.  HENT:     Head: Normocephalic.     Right Ear: External ear normal.     Left Ear: External ear normal.     Nose: Nose normal.     Mouth/Throat:     Pharynx: Oropharynx is clear.  Eyes:     Conjunctiva/sclera: Conjunctivae normal.  Neck:     Musculoskeletal: Normal range of motion.  Cardiovascular:     Rate and Rhythm: Normal rate and regular rhythm.     Pulses: Normal pulses.     Heart sounds: Normal heart sounds.  Pulmonary:     Effort: Pulmonary effort is normal.      Breath sounds: Normal breath sounds.  Abdominal:     General: Bowel sounds are normal.  Musculoskeletal: Normal range of motion.  Skin:    General: Skin is warm and dry.  Neurological:     General: No focal deficit present.     Mental Status: She is alert and oriented to person, place, and time.  Psychiatric:        Mood and Affect: Mood normal.  Behavior: Behavior normal.        Thought Content: Thought content normal.        Judgment: Judgment normal.      LABORATORY DATA: I have personally reviewed the data as listed:  No visits with results within 1 Month(s) from this visit.  Latest known visit with results is:  Admission on 05/13/2017, Discharged on 05/13/2017  Component Date Value Ref Range Status  . Hemoglobin 05/13/2017 12.7  12.0 - 15.0 g/dL Final  . Glucose-Capillary 05/13/2017 112* 65 - 99 mg/dL Final     ASSESSMENT/PLAN   Tobacco abuse 1. Tobacco abuse - This patient meets the criteria for low dose CT lung cancer screening. She is asymptomatic for any signs or symptoms of lung cancer. - The Shared Decision-Making Visit discussion included risks and benefits of screening, potential for follow up, diagnostic testing for abnormal scans, potential for false positive tests, over diagnosis, and discussion about total radiation exposure. - Patient stated willingness to undergo diagnostics and treatment as needed. - She was counseled on smoking cessation to decrease her risk of lung cancer, pulmonary disease, heart disease and stroke. - She was given a resource card with information on receiving free nicotine replacement therapy , and information about free smoking cessation classes. - She will present for her LDCT scan today and follow up with her PCP.      All questions were answered. The patient knows to call the clinic with any problems, questions or concerns.  This note was electronically signed.    Roger Shelter, FNP  02/07/2019 3:26 PM

## 2019-02-07 NOTE — Patient Instructions (Signed)
You were seen today for your shared decision making visit and a low-dose CT scan to screen for lung cancer.

## 2019-02-07 NOTE — Assessment & Plan Note (Addendum)
1. Tobacco abuse - This patient meets the criteria for low dose CT lung cancer screening. She is asymptomatic for any signs or symptoms of lung cancer. - The Shared Decision-Making Visit discussion included risks and benefits of screening, potential for follow up, diagnostic testing for abnormal scans, potential for false positive tests, over diagnosis, and discussion about total radiation exposure. - Patient stated willingness to undergo diagnostics and treatment as needed. - She was counseled on smoking cessation to decrease her risk of lung cancer, pulmonary disease, heart disease and stroke. - She was given a resource card with information on receiving free nicotine replacement therapy , and information about free smoking cessation classes. - She will present for her LDCT scan today and follow up with her PCP.

## 2019-02-08 ENCOUNTER — Encounter (HOSPITAL_COMMUNITY): Payer: Self-pay | Admitting: *Deleted

## 2019-02-08 ENCOUNTER — Other Ambulatory Visit (HOSPITAL_COMMUNITY): Payer: Self-pay | Admitting: *Deleted

## 2019-02-08 DIAGNOSIS — Z87891 Personal history of nicotine dependence: Secondary | ICD-10-CM

## 2019-02-08 DIAGNOSIS — R911 Solitary pulmonary nodule: Secondary | ICD-10-CM

## 2019-02-08 NOTE — Progress Notes (Signed)
Orders placed for 6 month follow up per Reynolds Bowl, NP

## 2019-02-08 NOTE — Progress Notes (Signed)
Patient was notified via telephone of results of low-dose CT scan. A copy of the results will be mailed to her as well as her primary care physician Dr. Manuella Ghazi in [place].  It is recommended by scan to scan again in 12 months, however, Reynolds Bowl, NP wants to rescan in 6 months.  Orders placed and patient is scheduled for pulmonary nodule follow up scan in 6 months.

## 2019-06-29 ENCOUNTER — Ambulatory Visit: Payer: Self-pay | Attending: Internal Medicine

## 2019-06-29 ENCOUNTER — Other Ambulatory Visit: Payer: Self-pay

## 2019-06-29 DIAGNOSIS — Z20822 Contact with and (suspected) exposure to covid-19: Secondary | ICD-10-CM | POA: Insufficient documentation

## 2019-06-30 LAB — NOVEL CORONAVIRUS, NAA: SARS-CoV-2, NAA: NOT DETECTED

## 2019-07-03 ENCOUNTER — Encounter (HOSPITAL_COMMUNITY): Payer: Self-pay | Admitting: *Deleted

## 2019-07-03 NOTE — Progress Notes (Signed)
Patient called clinic today wanting her test results from her COVID testing last week.  I advised patient of her results and to follow up with her primary care physician should she need anything further.  She verbalizes understanding.

## 2019-08-07 ENCOUNTER — Other Ambulatory Visit (HOSPITAL_COMMUNITY): Payer: Self-pay | Admitting: *Deleted

## 2019-08-07 DIAGNOSIS — R911 Solitary pulmonary nodule: Secondary | ICD-10-CM

## 2019-08-08 ENCOUNTER — Other Ambulatory Visit: Payer: Self-pay

## 2019-08-08 ENCOUNTER — Ambulatory Visit (HOSPITAL_COMMUNITY)
Admission: RE | Admit: 2019-08-08 | Discharge: 2019-08-08 | Disposition: A | Discharge status: Home / Self Care | Payer: Self-pay | Source: Ambulatory Visit | Attending: Hematology | Admitting: Hematology

## 2019-08-08 DIAGNOSIS — Z87891 Personal history of nicotine dependence: Secondary | ICD-10-CM | POA: Insufficient documentation

## 2019-08-08 DIAGNOSIS — R911 Solitary pulmonary nodule: Secondary | ICD-10-CM | POA: Insufficient documentation

## 2019-08-10 ENCOUNTER — Encounter (HOSPITAL_COMMUNITY): Payer: Self-pay | Admitting: *Deleted

## 2019-08-10 NOTE — Progress Notes (Signed)
Patient notified via mail of LDCT lung cancer screening results with recommendations to follow up in 12 months.  Also notified of incidental findings and need to follow up with PCP.  Patient's referring provider was sent a copy of results.     IMPRESSION: 1. Lung-RADS 2S, benign appearance or behavior. Continue annual screening with low-dose chest CT without contrast in 12 months. 2. The "S" modifier above refers to potentially clinically significant non lung cancer related findings. Specifically, there is aortic atherosclerosis, in addition to left anterior descending coronary artery disease. Please note that although the presence of coronary artery calcium documents the presence of coronary artery disease, the severity of this disease and any potential stenosis cannot be assessed on this non-gated CT examination. Assessment for potential risk factor modification, dietary therapy or pharmacologic therapy may be warranted, if clinically indicated. 3. Mild diffuse bronchial wall thickening with mild centrilobular and paraseptal emphysema; imaging findings suggestive of underlying COPD. 4. Mild cardiomegaly.  Aortic Atherosclerosis (ICD10-I70.0) and Emphysema (ICD10-J43.9).

## 2020-07-18 ENCOUNTER — Encounter (HOSPITAL_COMMUNITY): Payer: Self-pay

## 2020-07-25 ENCOUNTER — Other Ambulatory Visit (HOSPITAL_COMMUNITY): Payer: Self-pay

## 2020-07-25 DIAGNOSIS — Z122 Encounter for screening for malignant neoplasm of respiratory organs: Secondary | ICD-10-CM

## 2020-07-25 DIAGNOSIS — Z87891 Personal history of nicotine dependence: Secondary | ICD-10-CM

## 2020-07-25 NOTE — Progress Notes (Signed)
LDCT on 03/31 at 1100. Patient aware.

## 2020-08-22 ENCOUNTER — Ambulatory Visit (HOSPITAL_COMMUNITY)
Admission: RE | Admit: 2020-08-22 | Discharge: 2020-08-22 | Disposition: A | Discharge status: Home / Self Care | Payer: Self-pay | Source: Ambulatory Visit | Attending: Oncology | Admitting: Oncology

## 2020-08-22 DIAGNOSIS — Z122 Encounter for screening for malignant neoplasm of respiratory organs: Secondary | ICD-10-CM

## 2020-08-22 DIAGNOSIS — Z87891 Personal history of nicotine dependence: Secondary | ICD-10-CM

## 2020-08-26 ENCOUNTER — Encounter (HOSPITAL_COMMUNITY): Payer: Self-pay

## 2020-08-26 NOTE — Progress Notes (Signed)
Patient notified of LDCT Lung Cancer Screening Results via mail with the recommendation to follow-up in 12 months. Patient's referring provider has been sent a copy of results. Results are as follows:  IMPRESSION: 1. Lung-RADS 2, benign appearance or behavior. 2. Aortic Atherosclerosis (ICD10-I70.0) and Emphysema (ICD10-J43.9).

## 2021-04-25 ENCOUNTER — Other Ambulatory Visit: Payer: Self-pay | Admitting: Internal Medicine

## 2021-04-25 DIAGNOSIS — Z139 Encounter for screening, unspecified: Secondary | ICD-10-CM

## 2021-04-28 ENCOUNTER — Ambulatory Visit
Admission: RE | Admit: 2021-04-28 | Discharge: 2021-04-28 | Disposition: A | Discharge status: Home / Self Care | Payer: 59 | Source: Ambulatory Visit | Attending: Internal Medicine | Admitting: Internal Medicine

## 2021-04-28 ENCOUNTER — Other Ambulatory Visit: Payer: Self-pay

## 2021-04-28 DIAGNOSIS — Z139 Encounter for screening, unspecified: Secondary | ICD-10-CM

## 2021-07-02 ENCOUNTER — Other Ambulatory Visit (HOSPITAL_COMMUNITY): Payer: Self-pay | Admitting: Internal Medicine

## 2021-07-02 DIAGNOSIS — M5442 Lumbago with sciatica, left side: Secondary | ICD-10-CM

## 2021-07-15 ENCOUNTER — Ambulatory Visit (HOSPITAL_COMMUNITY)
Admission: RE | Admit: 2021-07-15 | Discharge: 2021-07-15 | Disposition: A | Discharge status: Home / Self Care | Payer: 59 | Source: Ambulatory Visit | Attending: Internal Medicine | Admitting: Internal Medicine

## 2021-07-15 ENCOUNTER — Other Ambulatory Visit: Payer: Self-pay

## 2021-07-15 DIAGNOSIS — M5442 Lumbago with sciatica, left side: Secondary | ICD-10-CM | POA: Insufficient documentation

## 2021-10-08 ENCOUNTER — Ambulatory Visit: Payer: 59 | Admitting: Internal Medicine

## 2021-10-15 ENCOUNTER — Encounter: Payer: Self-pay | Admitting: Internal Medicine

## 2021-10-15 ENCOUNTER — Encounter: Payer: Self-pay | Admitting: *Deleted

## 2021-10-15 ENCOUNTER — Ambulatory Visit: Payer: 59 | Admitting: Internal Medicine

## 2021-10-15 VITALS — BP 157/80 | HR 54 | Temp 97.7°F | Ht 66.0 in | Wt 265.8 lb

## 2021-10-15 DIAGNOSIS — K219 Gastro-esophageal reflux disease without esophagitis: Secondary | ICD-10-CM | POA: Diagnosis not present

## 2021-10-15 DIAGNOSIS — R1012 Left upper quadrant pain: Secondary | ICD-10-CM

## 2021-10-15 DIAGNOSIS — R197 Diarrhea, unspecified: Secondary | ICD-10-CM | POA: Diagnosis not present

## 2021-10-15 DIAGNOSIS — R142 Eructation: Secondary | ICD-10-CM | POA: Diagnosis not present

## 2021-10-15 MED ORDER — PEG 3350-KCL-NA BICARB-NACL 420 G PO SOLR: ORAL | 0 refills | Status: DC

## 2021-10-15 NOTE — Progress Notes (Signed)
Primary Care Physician:  Monico Blitz, MD Primary Gastroenterologist:  Dr. Abbey Chatters  Chief Complaint  Patient presents with   New Patient (Initial Visit)    Nausea, visit for colonoscopy    HPI:   Christy Randall is a 56 y.o. female who presents today by referral from her PCP Dr. Manuella Ghazi for evaluation.  Has numerous GI complaints for me today.  Has a history of chronic GERD which has historically been well controlled on Prevacid daily.  Notes for the last week she has had increased belching.  Very foul-smelling, sometimes clears the room.  Embarrassed to go on public due to this.  No dysphagia odynophagia.  Also with left upper quadrant abdominal pain which is new.  Pain intermittent, mild to moderate in severity, does not radiate.  No previous upper endoscopy.  No epigastric or chest pain.   Also with diarrhea.  States she felt this was due to her metformin though this was discontinued and she continues to have issues with diarrhea.  Notes up to 5-7 loose stools daily.  Takes Imodium which helps some.  No melena hematochezia.  States she had a colonoscopy over 10 years ago.  Unsure of findings.  Status post cholecystectomy 29 years ago.   Past Medical History:  Diagnosis Date   Anxiety    COPD (chronic obstructive pulmonary disease) ([facility])    Degenerative arthritis of lumbar spine    Frequent urination    Hypothyroid    Nerve pain    bilateral feet   Nocturia    OSA on CPAP    Osteoarthritis    Urgency of urination    Wears glasses     Past Surgical History:  Procedure Laterality Date   CESAREAN SECTION  1992   COLONOSCOPY     CYSTOSCOPY WITH HYDRODISTENSION AND BIOPSY N/A 05/13/2017   Procedure: CYSTOSCOPY Adriana Mccallum INSTILL MARCAINE AND PYRIDIUM;  Surgeon: Irine Seal, MD;  Location: [facility];  Service: Urology;  Laterality: N/A;   LAPAROSCOPIC CHOLECYSTECTOMY  1992   TOTAL KNEE ARTHROPLASTY Left 11/02/2016   Procedure: TOTAL KNEE ARTHROPLASTY;   Surgeon: Elsie Saas, MD;  Location: [facility];  Service: Orthopedics;  Laterality: Left;   TRANSTHORACIC ECHOCARDIOGRAM  10/01/2016   ef 64-40%, grade 1 diastolic dysfunction/  trivial MR and TR   TUBAL LIGATION Bilateral 1995    Current Outpatient Medications  Medication Sig Dispense Refill   Cholecalciferol (VITAMIN D3) 2000 units TABS Take 2,000 Units by mouth daily.     lansoprazole (PREVACID) 30 MG capsule Take 30 mg by mouth daily at 12 noon.     levothyroxine (SYNTHROID) 175 MCG tablet Take 175 mcg by mouth daily before breakfast.     losartan (COZAAR) 100 MG tablet Take 100 mg by mouth daily.     NAPROXEN PO Take by mouth.     PARoxetine (PAXIL) 10 MG tablet   1   rosuvastatin (CRESTOR) 5 MG tablet Take 5 mg by mouth daily.     Semaglutide (OZEMPIC, 0.25 OR 0.5 MG/DOSE, [place]) Inject into the skin.     tiZANidine (ZANAFLEX) 2 MG tablet Take by mouth every 6 (six) hours as needed for muscle spasms.     tiZANidine (ZANAFLEX) 4 MG capsule Take 4 mg by mouth 3 (three) times daily.     diazepam (VALIUM) 2 MG tablet Take 1 tablet (2 mg total) by mouth every 6 (six) hours as needed for anxiety. 30 tablet 0   gabapentin (NEURONTIN) 300 MG capsule  gabapentin 300 mg capsule     levothyroxine (SYNTHROID, LEVOTHROID) 125 MCG tablet Take 125 mcg by mouth daily before breakfast. (Patient not taking: Reported on 10/15/2021)     metFORMIN (GLUCOPHAGE) 500 MG tablet metformin 500 mg tablet     NON FORMULARY [facility]  Peripheral Neuropathy Cream- Bupivacaine 1%, Doxepin 3%, Gabapentin 6%, Pentoxifylline 3%, Topiramate 1% Apply 1-2 grams to affected area 3-4 times daily Qty. 120 gm 3 refills     traMADol (ULTRAM) 50 MG tablet Take by mouth every 6 (six) hours as needed.     No current facility-administered medications for this visit.    Allergies as of 10/15/2021 - Review Complete 10/15/2021  Allergen Reaction Noted   Morphine and related Other (See Comments)     Family History   Problem Relation Age of Onset   Diabetes Mother     Social History   Socioeconomic History   Marital status: Married    Spouse name: Not on file   Number of children: Not on file   Years of education: Not on file   Highest education level: Not on file  Occupational History   Not on file  Tobacco Use   Smoking status: Former    Packs/day: 1.50    Years: 37.00    Pack years: 55.50    Types: Cigarettes    Start date: 11/19/1986    Quit date: 05/24/2016    Years since quitting: 5.3   Smokeless tobacco: Never  Vaping Use   Vaping Use: Former  Substance and Sexual Activity   Alcohol use: No   Drug use: No   Sexual activity: Not on file  Other Topics Concern   Not on file  Social History Narrative   Not on file   Social Determinants of Health   Financial Resource Strain: Not on file  Food Insecurity: Not on file  Transportation Needs: Not on file  Physical Activity: Not on file  Stress: Not on file  Social Connections: Not on file  Intimate Partner Violence: Not on file    Subjective: Review of Systems  Constitutional:  Negative for chills and fever.  HENT:  Negative for congestion and hearing loss.   Eyes:  Negative for blurred vision and double vision.  Respiratory:  Negative for cough and shortness of breath.   Cardiovascular:  Negative for chest pain and palpitations.  Gastrointestinal:  Positive for abdominal pain and diarrhea. Negative for blood in stool, constipation, heartburn, melena and vomiting.  Genitourinary:  Negative for dysuria and urgency.  Musculoskeletal:  Negative for joint pain and myalgias.  Skin:  Negative for itching and rash.  Neurological:  Negative for dizziness and headaches.  Psychiatric/Behavioral:  Negative for depression. The patient is not nervous/anxious.       Objective: BP (!) 157/80   Pulse (!) 54   Temp 97.7 F (36.5 C)   Ht '5\' 6"'$  (1.676 m)   Wt 265 lb 12.8 oz (120.6 kg)   BMI 42.90 kg/m  Physical  Exam Constitutional:      Appearance: Normal appearance. She is obese.  HENT:     Head: Normocephalic and atraumatic.  Eyes:     Extraocular Movements: Extraocular movements intact.     Conjunctiva/sclera: Conjunctivae normal.  Cardiovascular:     Rate and Rhythm: Normal rate and regular rhythm.  Pulmonary:     Effort: Pulmonary effort is normal.     Breath sounds: Normal breath sounds.  Abdominal:     General: Bowel sounds  are normal.     Palpations: Abdomen is soft.     Tenderness: There is abdominal tenderness in the left upper quadrant.  Musculoskeletal:        General: No swelling. Normal range of motion.     Cervical back: Normal range of motion and neck supple.  Skin:    General: Skin is warm and dry.     Coloration: Skin is not jaundiced.  Neurological:     General: No focal deficit present.     Mental Status: She is alert and oriented to person, place, and time.  Psychiatric:        Mood and Affect: Mood normal.        Behavior: Behavior normal.     Assessment: *GERD-relatively well controlled on Prevacid daily *Belching-worsening, foul smelling *Diarrhea *Left upper quadrant abdominal pain  Plan: Check celiac panel as well as TSH today.  GI pathogen panel to rule out infectious causes of diarrhea.  Will schedule for EGD to evaluate for peptic ulcer disease, esophagitis, gastritis, H. Pylori, duodenitis, or other. Will also evaluate for esophageal stricture, Schatzki's ring, esophageal web or other.   At the same time we will perform colonoscopy with random colon biopsies to further evaluate abdominal pain and diarrhea.  The risks including infection, bleed, or perforation as well as benefits, limitations, alternatives and imponderables have been reviewed with the patient. Potential for esophageal dilation, biopsy, etc. have also been reviewed.  Questions have been answered. All parties agreeable.  Imodium as needed if GI pathogen panel negative.  Consider  cholestyramine for bile acid induced diarrhea pending endoscopic findings.  Continue on Prevacid daily for chronic GERD which is relatively well controlled.  Thank you Dr. Brigitte Pulse for the kind referral   10/15/2021 11:03 AM   Disclaimer: This note was dictated with voice recognition software. Similar sounding words can inadvertently be transcribed and may not be corrected upon review.

## 2021-10-15 NOTE — Patient Instructions (Signed)
I am going to order blood work at [facility] to check you for celiac disease as well as thyroid abnormalities.  I am also going to order stool studies to rule out infectious causes of your diarrhea.  We will set you up for EGD and colonoscopy to further evaluate your chronic belching, abdominal pain, diarrhea.  Continue on Imodium as needed.  It was very nice meeting you today.  Dr. Abbey Chatters  At [facility] we value your feedback. You may receive a survey about your visit today. Please share your experience as we strive to create trusting relationships with our patients to provide genuine, compassionate, quality care.  We appreciate your understanding and patience as we review any laboratory studies, imaging, and other diagnostic tests that are ordered as we care for you. Our office policy is 5 business days for review of these results, and any emergent or urgent results are addressed in a timely manner for your best interest. If you do not hear from our office in 1 week, please contact us.   We also encourage the use of MyChart, which contains your medical information for your review as well. If you are not enrolled in this feature, an access code is on this after visit summary for your convenience. Thank you for allowing [place] to be involved in your care.  It was great to see you today!  I hope you have a great rest of your Spring!    Elon Alas. Abbey Chatters, D.O. Gastroenterology and Hepatology [facility]

## 2021-10-15 NOTE — H&P (View-Only) (Signed)
Primary Care Physician:  Monico Blitz, MD Primary Gastroenterologist:  Dr. Abbey Chatters  Chief Complaint  Patient presents with   New Patient (Initial Visit)    Nausea, visit for colonoscopy    HPI:   Christy Randall is a 56 y.o. female who presents today by referral from her PCP Dr. Manuella Ghazi for evaluation.  Has numerous GI complaints for me today.  Has a history of chronic GERD which has historically been well controlled on Prevacid daily.  Notes for the last week she has had increased belching.  Very foul-smelling, sometimes clears the room.  Embarrassed to go on public due to this.  No dysphagia odynophagia.  Also with left upper quadrant abdominal pain which is new.  Pain intermittent, mild to moderate in severity, does not radiate.  No previous upper endoscopy.  No epigastric or chest pain.   Also with diarrhea.  States she felt this was due to her metformin though this was discontinued and she continues to have issues with diarrhea.  Notes up to 5-7 loose stools daily.  Takes Imodium which helps some.  No melena hematochezia.  States she had a colonoscopy over 10 years ago.  Unsure of findings.  Status post cholecystectomy 29 years ago.   Past Medical History:  Diagnosis Date   Anxiety    COPD (chronic obstructive pulmonary disease) (HCC)    Degenerative arthritis of lumbar spine    Frequent urination    Hypothyroid    Nerve pain    bilateral feet   Nocturia    OSA on CPAP    Osteoarthritis    Urgency of urination    Wears glasses     Past Surgical History:  Procedure Laterality Date   CESAREAN SECTION  1992   COLONOSCOPY     CYSTOSCOPY WITH HYDRODISTENSION AND BIOPSY N/A 05/13/2017   Procedure: CYSTOSCOPY Adriana Mccallum INSTILL MARCAINE AND PYRIDIUM;  Surgeon: Irine Seal, MD;  Location: [facility];  Service: Urology;  Laterality: N/A;   LAPAROSCOPIC CHOLECYSTECTOMY  1992   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     lansoprazole (PREVACID) 30 MG capsule Take 30 mg by mouth daily at 12 noon.     levothyroxine (SYNTHROID) 175 MCG tablet Take 175 mcg by mouth daily before breakfast.     losartan (COZAAR) 100 MG tablet Take 100 mg by mouth daily.     NAPROXEN PO Take by mouth.     PARoxetine (PAXIL) 10 MG tablet   1   rosuvastatin (CRESTOR) 5 MG tablet Take 5 mg by mouth daily.     Semaglutide (OZEMPIC, 0.25 OR 0.5 MG/DOSE, [place]) Inject into the skin.     tiZANidine (ZANAFLEX) 2 MG tablet Take by mouth every 6 (six) hours as needed for muscle spasms.     tiZANidine (ZANAFLEX) 4 MG capsule Take 4 mg by mouth 3 (three) times daily.     diazepam (VALIUM) 2 MG tablet Take 1 tablet (2 mg total) by mouth every 6 (six) hours as needed for anxiety. 30 tablet 0   gabapentin (NEURONTIN) 300 MG capsule  gabapentin 300 mg capsule     levothyroxine (SYNTHROID, LEVOTHROID) 125 MCG tablet Take 125 mcg by mouth daily before breakfast. (Patient not taking: Reported on 10/15/2021)     metFORMIN (GLUCOPHAGE) 500 MG tablet metformin 500 mg tablet     NON FORMULARY [facility]  Peripheral Neuropathy Cream- Bupivacaine 1%, Doxepin 3%, Gabapentin 6%, Pentoxifylline 3%, Topiramate 1% Apply 1-2 grams to affected area 3-4 times daily Qty. 120 gm 3 refills     traMADol (ULTRAM) 50 MG tablet Take by mouth every 6 (six) hours as needed.     No current facility-administered medications for this visit.    Allergies as of 10/15/2021 - Review Complete 10/15/2021  Allergen Reaction Noted   Morphine and related Other (See Comments)     Family History   Problem Relation Age of Onset   Diabetes Mother     Social History   Socioeconomic History   Marital status: Married    Spouse name: Not on file   Number of children: Not on file   Years of education: Not on file   Highest education level: Not on file  Occupational History   Not on file  Tobacco Use   Smoking status: Former    Packs/day: 1.50    Years: 37.00    Pack years: 55.50    Types: Cigarettes    Start date: 11/19/1986    Quit date: 05/24/2016    Years since quitting: 5.3   Smokeless tobacco: Never  Vaping Use   Vaping Use: Former  Substance and Sexual Activity   Alcohol use: No   Drug use: No   Sexual activity: Not on file  Other Topics Concern   Not on file  Social History Narrative   Not on file   Social Determinants of Health   Financial Resource Strain: Not on file  Food Insecurity: Not on file  Transportation Needs: Not on file  Physical Activity: Not on file  Stress: Not on file  Social Connections: Not on file  Intimate Partner Violence: Not on file    Subjective: Review of Systems  Constitutional:  Negative for chills and fever.  HENT:  Negative for congestion and hearing loss.   Eyes:  Negative for blurred vision and double vision.  Respiratory:  Negative for cough and shortness of breath.   Cardiovascular:  Negative for chest pain and palpitations.  Gastrointestinal:  Positive for abdominal pain and diarrhea. Negative for blood in stool, constipation, heartburn, melena and vomiting.  Genitourinary:  Negative for dysuria and urgency.  Musculoskeletal:  Negative for joint pain and myalgias.  Skin:  Negative for itching and rash.  Neurological:  Negative for dizziness and headaches.  Psychiatric/Behavioral:  Negative for depression. The patient is not nervous/anxious.       Objective: BP (!) 157/80   Pulse (!) 54   Temp 97.7 F (36.5 C)   Ht '5\' 6"'$  (1.676 m)   Wt 265 lb 12.8 oz (120.6 kg)   BMI 42.90 kg/m  Physical  Exam Constitutional:      Appearance: Normal appearance. She is obese.  HENT:     Head: Normocephalic and atraumatic.  Eyes:     Extraocular Movements: Extraocular movements intact.     Conjunctiva/sclera: Conjunctivae normal.  Cardiovascular:     Rate and Rhythm: Normal rate and regular rhythm.  Pulmonary:     Effort: Pulmonary effort is normal.     Breath sounds: Normal breath sounds.  Abdominal:     General: Bowel sounds  are normal.     Palpations: Abdomen is soft.     Tenderness: There is abdominal tenderness in the left upper quadrant.  Musculoskeletal:        General: No swelling. Normal range of motion.     Cervical back: Normal range of motion and neck supple.  Skin:    General: Skin is warm and dry.     Coloration: Skin is not jaundiced.  Neurological:     General: No focal deficit present.     Mental Status: She is alert and oriented to person, place, and time.  Psychiatric:        Mood and Affect: Mood normal.        Behavior: Behavior normal.     Assessment: *GERD-relatively well controlled on Prevacid daily *Belching-worsening, foul smelling *Diarrhea *Left upper quadrant abdominal pain  Plan: Check celiac panel as well as TSH today.  GI pathogen panel to rule out infectious causes of diarrhea.  Will schedule for EGD to evaluate for peptic ulcer disease, esophagitis, gastritis, H. Pylori, duodenitis, or other. Will also evaluate for esophageal stricture, Schatzki's ring, esophageal web or other.   At the same time we will perform colonoscopy with random colon biopsies to further evaluate abdominal pain and diarrhea.  The risks including infection, bleed, or perforation as well as benefits, limitations, alternatives and imponderables have been reviewed with the patient. Potential for esophageal dilation, biopsy, etc. have also been reviewed.  Questions have been answered. All parties agreeable.  Imodium as needed if GI pathogen panel negative.  Consider  cholestyramine for bile acid induced diarrhea pending endoscopic findings.  Continue on Prevacid daily for chronic GERD which is relatively well controlled.  Thank you Dr. Brigitte Pulse for the kind referral   10/15/2021 11:03 AM   Disclaimer: This note was dictated with voice recognition software. Similar sounding words can inadvertently be transcribed and may not be corrected upon review.

## 2021-10-16 ENCOUNTER — Other Ambulatory Visit: Payer: Self-pay | Admitting: Internal Medicine

## 2021-10-17 LAB — GI PROFILE, STOOL, PCR

## 2021-10-19 LAB — CELIAC AB TTG DGP TIGA
Antigliadin Abs, IgA: 13 units (ref 0–19)
Gliadin IgG: 3 units (ref 0–19)
IgA/Immunoglobulin A, Serum: 305 mg/dL (ref 87–352)
Tissue Transglut Ab: <2 U/mL (ref 0–5)
Transglutaminase IgA: <2 U/mL (ref 0–3)

## 2021-10-19 LAB — TSH: TSH: 0.464 u[IU]/mL (ref 0.450–4.500)

## 2021-10-22 ENCOUNTER — Telehealth: Payer: Self-pay

## 2021-10-22 NOTE — Telephone Encounter (Signed)
Pt LMOVM requesting to be called if TCS/EGD for 11/10/21 can be moved up sooner.  Added pt to cancellation list.

## 2021-10-22 NOTE — Telephone Encounter (Signed)
Hey The pt's GI profile has a that has detected Norovirus GI/GII. Please advise

## 2021-10-23 NOTE — Telephone Encounter (Signed)
Called pt, informed her of pre-op appt 10/29/21 at 8:00am.

## 2021-10-23 NOTE — Telephone Encounter (Signed)
Called pt, TCS/EGD moved up to 10/30/21 at 12:15pm. Endo scheduler informed. New instructions sent to pt via MyChart.

## 2021-10-27 NOTE — Patient Instructions (Signed)
KHADIJATOU BORAK  10/27/2021     '@PREFPERIOPPHARMACY'$ @   Your procedure is scheduled on  10/30/2021.   Report to [facility] at  1015  A.M.   Call this number if you have problems the morning of surgery:  [phone]   Remember:  Follow the diet and prep instructions given to you by the office.    DO NOT take any medications for diabetes the morning of your procedure.     Use your inhaler before you come and bting your rescue inhaler with you.    Take these medicines the morning of surgery with A SIP OF WATER                        prevacid, levothyroxine.     Do not wear jewelry, make-up or nail polish.  Do not wear lotions, powders, or perfumes, or deodorant.  Do not shave 48 hours prior to surgery.  Men may shave face and neck.  Do not bring valuables to the hospital.  [facility] is not responsible for any belongings or valuables.  Contacts, dentures or bridgework may not be worn into surgery.  Leave your suitcase in the car.  After surgery it may be brought to your room.  For patients admitted to the hospital, discharge time will be determined by your treatment team.  Patients discharged the day of surgery will not be allowed to drive home and must have someone with them for 24 hours.    Special instructions:   DO NOT smoke tobacco or vape for 24 hours before your procedure.  Please read over the following fact sheets that you were given. Anesthesia Post-op Instructions and Care and Recovery After Surgery      Upper Endoscopy, Adult, Care After This sheet gives you information about how to care for yourself after your procedure. Your health care provider may also give you more specific instructions. If you have problems or questions, contact your health care provider. What can I expect after the procedure? After the procedure, it is common to have: A sore throat. Mild stomach pain or discomfort. Bloating. Nausea. Follow these instructions at  home:  Follow instructions from your health care provider about what to eat or drink after your procedure. Return to your normal activities as told by your health care provider. Ask your health care provider what activities are safe for you. Take over-the-counter and prescription medicines only as told by your health care provider. If you were given a sedative during the procedure, it can affect you for several hours. Do not drive or operate machinery until your health care provider says that it is safe. Keep all follow-up visits as told by your health care provider. This is important. Contact a health care provider if you have: A sore throat that lasts longer than one day. Trouble swallowing. Get help right away if: You vomit blood or your vomit looks like coffee grounds. You have: A fever. Bloody, black, or tarry stools. A severe sore throat or you cannot swallow. Difficulty breathing. Severe pain in your chest or abdomen. Summary After the procedure, it is common to have a sore throat, mild stomach discomfort, bloating, and nausea. If you were given a sedative during the procedure, it can affect you for several hours. Do not drive or operate machinery until your health care provider says that it is safe. Follow instructions from your health care provider about what  to eat or drink after your procedure. Return to your normal activities as told by your health care provider. This information is not intended to replace advice given to you by your health care provider. Make sure you discuss any questions you have with your health care provider. Document Revised: 03/17/2019 Document Reviewed: 10/11/2017 Elsevier Patient Education  [place]. Colonoscopy, Adult, Care After The following information offers guidance on how to care for yourself after your procedure. Your health care provider may also give you more specific instructions. If you have problems or questions, contact your  health care provider. What can I expect after the procedure? After the procedure, it is common to have: A small amount of blood in your stool for 24 hours after the procedure. Some gas. Mild cramping or bloating of your abdomen. Follow these instructions at home: Eating and drinking  Drink enough fluid to keep your urine pale yellow. Follow instructions from your health care provider about eating or drinking restrictions. Resume your normal diet as told by your health care provider. Avoid heavy or fried foods that are hard to digest. Activity Rest as told by your health care provider. Avoid sitting for a long time without moving. Get up to take short walks every 1-2 hours. This is important to improve blood flow and breathing. Ask for help if you feel weak or unsteady. Return to your normal activities as told by your health care provider. Ask your health care provider what activities are safe for you. Managing cramping and bloating  Try walking around when you have cramps or feel bloated. If directed, apply heat to your abdomen as told by your health care provider. Use the heat source that your health care provider recommends, such as a moist heat pack or a heating pad. Place a towel between your skin and the heat source. Leave the heat on for 20-30 minutes. Remove the heat if your skin turns bright red. This is especially important if you are unable to feel pain, heat, or cold. You have a greater risk of getting burned. General instructions If you were given a sedative during the procedure, it can affect you for several hours. Do not drive or operate machinery until your health care provider says that it is safe. For the first 24 hours after the procedure: Do not sign important documents. Do not drink alcohol. Do your regular daily activities at a slower pace than normal. Eat soft foods that are easy to digest. Take over-the-counter and prescription medicines only as told by your  health care provider. Keep all follow-up visits. This is important. Contact a health care provider if: You have blood in your stool 2-3 days after the procedure. Get help right away if: You have more than a small spotting of blood in your stool. You have large blood clots in your stool. You have swelling of your abdomen. You have nausea or vomiting. You have a fever. You have increasing pain in your abdomen that is not relieved with medicine. These symptoms may be an emergency. Get help right away. Call 911. Do not wait to see if the symptoms will go away. Do not drive yourself to the hospital. Summary After the procedure, it is common to have a small amount of blood in your stool. You may also have mild cramping and bloating of your abdomen. If you were given a sedative during the procedure, it can affect you for several hours. Do not drive or operate machinery until your health care  provider says that it is safe. Get help right away if you have a lot of blood in your stool, nausea or vomiting, a fever, or increased pain in your abdomen. This information is not intended to replace advice given to you by your health care provider. Make sure you discuss any questions you have with your health care provider. Document Revised: 01/01/2021 Document Reviewed: 01/01/2021 Elsevier Patient Education  [place] After This sheet gives you information about how to care for yourself after your procedure. Your health care provider may also give you more specific instructions. If you have problems or questions, contact your health care provider. What can I expect after the procedure? After the procedure, it is common to have: Tiredness. Forgetfulness about what happened after the procedure. Impaired judgment for important decisions. Nausea or vomiting. Some difficulty with balance. Follow these instructions at home: For the time period you were told by your  health care provider:     Rest as needed. Do not participate in activities where you could fall or become injured. Do not drive or use machinery. Do not drink alcohol. Do not take sleeping pills or medicines that cause drowsiness. Do not make important decisions or sign legal documents. Do not take care of children on your own. Eating and drinking Follow the diet that is recommended by your health care provider. Drink enough fluid to keep your urine pale yellow. If you vomit: Drink water, juice, or soup when you can drink without vomiting. Make sure you have little or no nausea before eating solid foods. General instructions Have a responsible adult stay with you for the time you are told. It is important to have someone help care for you until you are awake and alert. Take over-the-counter and prescription medicines only as told by your health care provider. If you have sleep apnea, surgery and certain medicines can increase your risk for breathing problems. Follow instructions from your health care provider about wearing your sleep device: Anytime you are sleeping, including during daytime naps. While taking prescription pain medicines, sleeping medicines, or medicines that make you drowsy. Avoid smoking. Keep all follow-up visits as told by your health care provider. This is important. Contact a health care provider if: You keep feeling nauseous or you keep vomiting. You feel light-headed. You are still sleepy or having trouble with balance after 24 hours. You develop a rash. You have a fever. You have redness or swelling around the IV site. Get help right away if: You have trouble breathing. You have new-onset confusion at home. Summary For several hours after your procedure, you may feel tired. You may also be forgetful and have poor judgment. Have a responsible adult stay with you for the time you are told. It is important to have someone help care for you until you are  awake and alert. Rest as told. Do not drive or operate machinery. Do not drink alcohol or take sleeping pills. Get help right away if you have trouble breathing, or if you suddenly become confused. This information is not intended to replace advice given to you by your health care provider. Make sure you discuss any questions you have with your health care provider. Document Revised: 04/15/2021 Document Reviewed: 04/13/2019 Elsevier Patient Education  [place].

## 2021-10-29 ENCOUNTER — Encounter (HOSPITAL_COMMUNITY): Payer: Self-pay

## 2021-10-29 ENCOUNTER — Encounter (HOSPITAL_COMMUNITY)
Admission: RE | Admit: 2021-10-29 | Discharge: 2021-10-29 | Disposition: A | Discharge status: Home / Self Care | Payer: 59 | Source: Ambulatory Visit | Attending: Internal Medicine | Admitting: Internal Medicine

## 2021-10-29 VITALS — BP 157/73 | HR 57 | Temp 97.7°F | Resp 18 | Ht 66.0 in | Wt 265.8 lb

## 2021-10-29 DIAGNOSIS — E119 Type 2 diabetes mellitus without complications: Secondary | ICD-10-CM | POA: Insufficient documentation

## 2021-10-29 DIAGNOSIS — J449 Chronic obstructive pulmonary disease, unspecified: Secondary | ICD-10-CM | POA: Diagnosis not present

## 2021-10-29 DIAGNOSIS — Z01818 Encounter for other preprocedural examination: Secondary | ICD-10-CM | POA: Insufficient documentation

## 2021-10-29 HISTORY — DX: Type 2 diabetes mellitus without complications: E11.9

## 2021-10-29 HISTORY — DX: Essential (primary) hypertension: I10

## 2021-10-29 LAB — BASIC METABOLIC PANEL
Anion gap: 6 (ref 5–15)
BUN: 16 mg/dL (ref 6–20)
CO2: 25 mmol/L (ref 22–32)
Calcium: 9.2 mg/dL (ref 8.9–10.3)
Chloride: 106 mmol/L (ref 98–111)
Creatinine, Ser: 0.97 mg/dL (ref 0.44–1.00)
GFR, Estimated: >60 mL/min (ref >60)
Glucose, Bld: 105 mg/dL — ABNORMAL HIGH (ref 70–99)
Potassium: 3.9 mmol/L (ref 3.5–5.1)
Sodium: 137 mmol/L (ref 135–145)

## 2021-10-30 ENCOUNTER — Ambulatory Visit (HOSPITAL_COMMUNITY)
Admission: RE | Admit: 2021-10-30 | Discharge: 2021-10-30 | Disposition: A | Discharge status: Home / Self Care | Payer: 59 | Attending: Internal Medicine | Admitting: Internal Medicine

## 2021-10-30 ENCOUNTER — Other Ambulatory Visit: Payer: Self-pay

## 2021-10-30 ENCOUNTER — Ambulatory Visit (HOSPITAL_COMMUNITY): Payer: 59 | Admitting: Anesthesiology

## 2021-10-30 ENCOUNTER — Encounter (HOSPITAL_COMMUNITY)
Admission: RE | Disposition: A | Discharge status: Home / Self Care | Payer: Self-pay | Source: Home / Self Care | Attending: Internal Medicine

## 2021-10-30 ENCOUNTER — Ambulatory Visit (HOSPITAL_BASED_OUTPATIENT_CLINIC_OR_DEPARTMENT_OTHER): Payer: 59 | Admitting: Anesthesiology

## 2021-10-30 ENCOUNTER — Encounter (HOSPITAL_COMMUNITY): Payer: Self-pay

## 2021-10-30 DIAGNOSIS — F419 Anxiety disorder, unspecified: Secondary | ICD-10-CM | POA: Insufficient documentation

## 2021-10-30 DIAGNOSIS — K529 Noninfective gastroenteritis and colitis, unspecified: Secondary | ICD-10-CM

## 2021-10-30 DIAGNOSIS — E119 Type 2 diabetes mellitus without complications: Secondary | ICD-10-CM | POA: Diagnosis not present

## 2021-10-30 DIAGNOSIS — K648 Other hemorrhoids: Secondary | ICD-10-CM | POA: Diagnosis not present

## 2021-10-30 DIAGNOSIS — R1012 Left upper quadrant pain: Secondary | ICD-10-CM | POA: Insufficient documentation

## 2021-10-30 DIAGNOSIS — K2970 Gastritis, unspecified, without bleeding: Secondary | ICD-10-CM | POA: Diagnosis not present

## 2021-10-30 DIAGNOSIS — Z79899 Other long term (current) drug therapy: Secondary | ICD-10-CM | POA: Diagnosis not present

## 2021-10-30 DIAGNOSIS — I10 Essential (primary) hypertension: Secondary | ICD-10-CM | POA: Insufficient documentation

## 2021-10-30 DIAGNOSIS — G4733 Obstructive sleep apnea (adult) (pediatric): Secondary | ICD-10-CM | POA: Insufficient documentation

## 2021-10-30 DIAGNOSIS — Z9049 Acquired absence of other specified parts of digestive tract: Secondary | ICD-10-CM | POA: Diagnosis not present

## 2021-10-30 DIAGNOSIS — K5730 Diverticulosis of large intestine without perforation or abscess without bleeding: Secondary | ICD-10-CM

## 2021-10-30 DIAGNOSIS — D123 Benign neoplasm of transverse colon: Secondary | ICD-10-CM | POA: Insufficient documentation

## 2021-10-30 DIAGNOSIS — R140 Abdominal distension (gaseous): Secondary | ICD-10-CM | POA: Insufficient documentation

## 2021-10-30 DIAGNOSIS — J449 Chronic obstructive pulmonary disease, unspecified: Secondary | ICD-10-CM | POA: Diagnosis not present

## 2021-10-30 DIAGNOSIS — R12 Heartburn: Secondary | ICD-10-CM | POA: Diagnosis not present

## 2021-10-30 DIAGNOSIS — D124 Benign neoplasm of descending colon: Secondary | ICD-10-CM | POA: Insufficient documentation

## 2021-10-30 DIAGNOSIS — K219 Gastro-esophageal reflux disease without esophagitis: Secondary | ICD-10-CM | POA: Diagnosis not present

## 2021-10-30 DIAGNOSIS — E039 Hypothyroidism, unspecified: Secondary | ICD-10-CM | POA: Insufficient documentation

## 2021-10-30 DIAGNOSIS — K635 Polyp of colon: Secondary | ICD-10-CM

## 2021-10-30 HISTORY — PX: ESOPHAGOGASTRODUODENOSCOPY (EGD) WITH PROPOFOL: SHX5813

## 2021-10-30 HISTORY — PX: COLONOSCOPY WITH PROPOFOL: SHX5780

## 2021-10-30 HISTORY — PX: POLYPECTOMY: SHX5525

## 2021-10-30 HISTORY — PX: BIOPSY: SHX5522

## 2021-10-30 SURGERY — COLONOSCOPY WITH PROPOFOL
Anesthesia: General

## 2021-10-30 MED ORDER — LACTATED RINGERS IV SOLN: INTRAVENOUS | Status: DC | PRN

## 2021-10-30 MED ORDER — PROPOFOL 500 MG/50ML IV EMUL
INTRAVENOUS | Status: AC
  Filled 2021-10-30: qty 50

## 2021-10-30 MED ORDER — DEXMEDETOMIDINE (PRECEDEX) IN NS 20 MCG/5ML (4 MCG/ML) IV SYRINGE
PREFILLED_SYRINGE | INTRAVENOUS | Status: DC | PRN
  Administered 2021-10-30: 12 ug via INTRAVENOUS

## 2021-10-30 MED ORDER — PROPOFOL 10 MG/ML IV BOLUS
INTRAVENOUS | Status: AC
  Filled 2021-10-30: qty 20

## 2021-10-30 MED ORDER — PROPOFOL 10 MG/ML IV BOLUS
INTRAVENOUS | Status: DC | PRN
  Administered 2021-10-30: 100 mg via INTRAVENOUS

## 2021-10-30 MED ORDER — PROPOFOL 500 MG/50ML IV EMUL
INTRAVENOUS | Status: DC | PRN
  Administered 2021-10-30: 100 ug/kg/min via INTRAVENOUS
  Administered 2021-10-30: 180 ug/kg/min via INTRAVENOUS

## 2021-10-30 MED ORDER — LIDOCAINE HCL (PF) 2 % IJ SOLN
INTRAMUSCULAR | Status: AC
  Filled 2021-10-30: qty 5

## 2021-10-30 NOTE — Discharge Instructions (Addendum)
EGD Discharge instructions Please read the instructions outlined below and refer to this sheet in the next few weeks. These discharge instructions provide you with general information on caring for yourself after you leave the hospital. Your doctor may also give you specific instructions. While your treatment has been planned according to the most current medical practices available, unavoidable complications occasionally occur. If you have any problems or questions after discharge, please call your doctor. ACTIVITY You may resume your regular activity but move at a slower pace for the next 24 hours.  Take frequent rest periods for the next 24 hours.  Walking will help expel (get rid of) the air and reduce the bloated feeling in your abdomen.  No driving for 24 hours (because of the anesthesia (medicine) used during the test).  You may shower.  Do not sign any important legal documents or operate any machinery for 24 hours (because of the anesthesia used during the test).  NUTRITION Drink plenty of fluids.  You may resume your normal diet.  Begin with a light meal and progress to your normal diet.  Avoid alcoholic beverages for 24 hours or as instructed by your caregiver.  MEDICATIONS You may resume your normal medications unless your caregiver tells you otherwise.  WHAT YOU CAN EXPECT TODAY You may experience abdominal discomfort such as a feeling of fullness or "gas" pains.  FOLLOW-UP Your doctor will discuss the results of your test with you.  SEEK IMMEDIATE MEDICAL ATTENTION IF ANY OF THE FOLLOWING OCCUR: Excessive nausea (feeling sick to your stomach) and/or vomiting.  Severe abdominal pain and distention (swelling).  Trouble swallowing.  Temperature over 101 F (37.8 C).  Rectal bleeding or vomiting of blood.     Colonoscopy Discharge Instructions  Read the instructions outlined below and refer to this sheet in the next few weeks. These discharge instructions provide you with  general information on caring for yourself after you leave the hospital. Your doctor may also give you specific instructions. While your treatment has been planned according to the most current medical practices available, unavoidable complications occasionally occur.   ACTIVITY You may resume your regular activity, but move at a slower pace for the next 24 hours.  Take frequent rest periods for the next 24 hours.  Walking will help get rid of the air and reduce the bloated feeling in your belly (abdomen).  No driving for 24 hours (because of the medicine (anesthesia) used during the test).   Do not sign any important legal documents or operate any machinery for 24 hours (because of the anesthesia used during the test).  NUTRITION Drink plenty of fluids.  You may resume your normal diet as instructed by your doctor.  Begin with a light meal and progress to your normal diet. Heavy or fried foods are harder to digest and may make you feel sick to your stomach (nauseated).  Avoid alcoholic beverages for 24 hours or as instructed.  MEDICATIONS You may resume your normal medications unless your doctor tells you otherwise.  WHAT YOU CAN EXPECT TODAY Some feelings of bloating in the abdomen.  Passage of more gas than usual.  Spotting of blood in your stool or on the toilet paper.  IF YOU HAD POLYPS REMOVED DURING THE COLONOSCOPY: No aspirin products for 7 days or as instructed.  No alcohol for 7 days or as instructed.  Eat a soft diet for the next 24 hours.  FINDING OUT THE RESULTS OF YOUR TEST Not all test results are  available during your visit. If your test results are not back during the visit, make an appointment with your caregiver to find out the results. Do not assume everything is normal if you have not heard from your caregiver or the medical facility. It is important for you to follow up on all of your test results.  SEEK IMMEDIATE MEDICAL ATTENTION IF: You have more than a spotting of  blood in your stool.  Your belly is swollen (abdominal distention).  You are nauseated or vomiting.  You have a temperature over 101.  You have abdominal pain or discomfort that is severe or gets worse throughout the day.   Your EGD revealed mild amount inflammation in your stomach.  I took biopsies of this to rule out infection with a bacteria called H. pylori.  Await pathology results, my office will contact you. Continue on Prevacid.   Your colonoscopy revealed 5 polyp(s) which I removed successfully. Await pathology results, my office will contact you. I recommend repeating colonoscopy in 3 years for surveillance purposes.    You also have diverticulosis and internal hemorrhoids. I would recommend increasing fiber in your diet or adding OTC Benefiber/Metamucil. Be sure to drink at least 4 to 6 glasses of water daily.   I took biopsies of your colon to rule out a condition called microscopic colitis which can cause chronic diarrhea especially in women.   Follow up with GI in 3-4 months.     I hope you have a great rest of your week!  Elon Alas. Abbey Chatters, D.O. Gastroenterology and Hepatology [facility]

## 2021-10-30 NOTE — Transfer of Care (Addendum)
Immediate Anesthesia Transfer of Care Note  Patient: Christy Randall  Procedure(s) Performed: COLONOSCOPY WITH PROPOFOL ESOPHAGOGASTRODUODENOSCOPY (EGD) WITH PROPOFOL BIOPSY POLYPECTOMY  Patient Location: Short Stay  Anesthesia Type:General  Level of Consciousness: awake and patient cooperative  Airway & Oxygen Therapy: Patient Spontanous Breathing  Post-op Assessment: Report given to RN and Post -op Vital signs reviewed and stable  Post vital signs: Reviewed and stable  Last Vitals:  Vitals Value Taken Time  BP 108/80 1207  Temp 97.5 1207  Pulse 60 1207  Resp 23 1207  SpO2 98 1207    Last Pain:  Vitals:   10/30/21 1122  TempSrc:   PainSc: 3          Complications: No notable events documented.

## 2021-10-30 NOTE — Op Note (Signed)
[facility] Patient Name: Christy Randall Procedure Date: 10/30/2021 11:38 AM MRN: [id number] Date of Birth: 27-Oct-1965 Attending MD: Elon Alas. Abbey Chatters DO CSN: [id number] Age: 56 Admit Type: Outpatient Procedure:                Colonoscopy Indications:              Chronic diarrhea Providers:                Elon Alas. Abbey Chatters, DO, Charlsie Quest. Insurance claims handler, Therapist, sports,                            Suzan Garibaldi. Risa Grill, Technician Referring MD:              Medicines:                See the Anesthesia note for documentation of the                            administered medications Complications:            No immediate complications. Estimated Blood Loss:     Estimated blood loss was minimal. Procedure:                Pre-Anesthesia Assessment:                           - The anesthesia plan was to use monitored                            anesthesia care (MAC).                           After obtaining informed consent, the colonoscope                            was passed under direct vision. Throughout the                            procedure, the patient's blood pressure, pulse, and                            oxygen saturations were monitored continuously. The                            [id number]-HQ190L ([id number]) scope was introduced through                            the anus and advanced to the the cecum, identified                            by appendiceal orifice and ileocecal valve. The                            colonoscopy was performed without difficulty. The                            patient tolerated the procedure well.  The quality                            of the bowel preparation was evaluated using the                            BBPS [facility] Bowel Preparation Scale) with scores                            of: Right Colon = 2 (minor amount of residual                            staining, small fragments of stool and/or opaque                            liquid, but mucosa seen well),  Transverse Colon = 2                            (minor amount of residual staining, small fragments                            of stool and/or opaque liquid, but mucosa seen                            well) and Left Colon = 3 (entire mucosa seen well                            with no residual staining, small fragments of stool                            or opaque liquid). The total BBPS score equals 7.                            The quality of the bowel preparation was good. Scope In: 11:40:39 AM Scope Out: 12:02:36 PM Scope Withdrawal Time: 0 hours 19 minutes 51 seconds  Total Procedure Duration: 0 hours 21 minutes 57 seconds  Findings:      The perianal and digital rectal examinations were normal.      Non-bleeding internal hemorrhoids were found during endoscopy.      Multiple small-mouthed diverticula were found in the sigmoid colon.      Three sessile polyps were found in the transverse colon. The polyps were       5 to 7 mm in size. These polyps were removed with a cold snare.       Resection and retrieval were complete.      A 4 mm polyp was found in the descending colon. The polyp was sessile.       The polyp was removed with a cold snare. Resection and retrieval were       complete.      A 8 mm polyp was found in the descending colon. The polyp was       pedunculated. The polyp was removed with a cold snare. Resection and       retrieval were complete.      Biopsies  for histology were taken with a cold forceps from the ascending       colon, transverse colon and descending colon for evaluation of       microscopic colitis. Impression:               - Non-bleeding internal hemorrhoids.                           - Diverticulosis in the sigmoid colon.                           - Three 5 to 7 mm polyps in the transverse colon,                            removed with a cold snare. Resected and retrieved.                           - One 4 mm polyp in the descending colon, removed                             with a cold snare. Resected and retrieved.                           - One 8 mm polyp in the descending colon, removed                            with a cold snare. Resected and retrieved.                           - Biopsies were taken with a cold forceps from the                            ascending colon, transverse colon and descending                            colon for evaluation of microscopic colitis. Moderate Sedation:      Per Anesthesia Care Recommendation:           - Patient has a contact number available for                            emergencies. The signs and symptoms of potential                            delayed complications were discussed with the                            patient. Return to normal activities tomorrow.                            Written discharge instructions were provided to the                            patient.                           -  Resume previous diet.                           - Continue present medications.                           - Await pathology results.                           - Repeat colonoscopy in 3 years for surveillance.                           - Return to GI clinic in 4 months. Procedure Code(s):        --- Professional ---                           [phone], Colonoscopy, flexible; with removal of                            tumor(s), polyp(s), or other lesion(s) by snare                            technique                           45380, 65, Colonoscopy, flexible; with biopsy,                            single or multiple Diagnosis Code(s):        --- Professional ---                           K64.8, Other hemorrhoids                           K63.5, Polyp of colon                           K52.9, Noninfective gastroenteritis and colitis,                            unspecified                           K57.30, Diverticulosis of large intestine without                            perforation  or abscess without bleeding CPT copyright 2019 American Medical Association. All rights reserved. The codes documented in this report are preliminary and upon coder review may  be revised to meet current compliance requirements. Elon Alas. Abbey Chatters, DO [place] Abbey Chatters, DO 10/30/2021 12:06:43 PM This report has been signed electronically. Number of Addenda: 0

## 2021-10-30 NOTE — Op Note (Signed)
[facility] Patient Name: Christy Randall Procedure Date: 10/30/2021 11:16 AM MRN: [id number] Date of Birth: [birth year] Attending MD: Elon Alas. Abbey Chatters DO CSN: [id number] Age: 56 Admit Type: Outpatient Procedure:                Upper GI endoscopy Indications:              Abdominal pain in the left upper quadrant,                            Heartburn, Abdominal bloating Providers:                Elon Alas. Abbey Chatters, DO, Charlsie Quest. Insurance claims handler, Therapist, sports,                            Suzan Garibaldi. Risa Grill, Technician Referring MD:              Medicines:                See the Anesthesia note for documentation of the                            administered medications Complications:            No immediate complications. Estimated Blood Loss:     Estimated blood loss was minimal. Procedure:                Pre-Anesthesia Assessment:                           - The anesthesia plan was to use monitored                            anesthesia care (MAC).                           After obtaining informed consent, the endoscope was                            passed under direct vision. Throughout the                            procedure, the patient's blood pressure, pulse, and                            oxygen saturations were monitored continuously. The                            [id number]-H190 ([id number]) scope was introduced through the                            mouth, and advanced to the second part of duodenum.                            The upper GI endoscopy was accomplished without  difficulty. The patient tolerated the procedure                            well. Scope In: 11:31:40 AM Scope Out: 11:36:24 AM Total Procedure Duration: 0 hours 4 minutes 44 seconds  Findings:      There is no endoscopic evidence of areas of erosion, esophagitis, hiatal       hernia, ulcerations or varices in the entire esophagus.      Patchy mild inflammation characterized by erythema was  found in the       gastric body and in the gastric antrum. Biopsies were taken with a cold       forceps for Helicobacter pylori testing.      The duodenal bulb, first portion of the duodenum and second portion of       the duodenum were normal. Impression:               - Gastritis. Biopsied.                           - Normal duodenal bulb, first portion of the                            duodenum and second portion of the duodenum. Moderate Sedation:      Per Anesthesia Care Recommendation:           - Patient has a contact number available for                            emergencies. The signs and symptoms of potential                            delayed complications were discussed with the                            patient. Return to normal activities tomorrow.                            Written discharge instructions were provided to the                            patient.                           - Resume previous diet.                           - Continue present medications.                           - Await pathology results.                           - Use Prevacid (lansoprazole) 30 mg PO daily. Procedure Code(s):        --- Professional ---                           [phone], Esophagogastroduodenoscopy, flexible,  transoral; with biopsy, single or multiple Diagnosis Code(s):        --- Professional ---                           K29.70, Gastritis, unspecified, without bleeding                           R10.12, Left upper quadrant pain                           R12, Heartburn                           R14.0, Abdominal distension (gaseous) CPT copyright 2019 American Medical Association. All rights reserved. The codes documented in this report are preliminary and upon coder review may  be revised to meet current compliance requirements. Elon Alas. Abbey Chatters, DO [place] Abbey Chatters, DO 10/30/2021 11:39:41 AM This report has been signed electronically. Number  of Addenda: 0

## 2021-10-30 NOTE — Interval H&P Note (Signed)
History and Physical Interval Note:  10/30/2021 11:11 AM  Christy Randall  has presented today for surgery, with the diagnosis of DIARRHEA, BELCHING, GERD, LUQ PAIN.  The various methods of treatment have been discussed with the patient and family. After consideration of risks, benefits and other options for treatment, the patient has consented to  Procedure(s) with comments: COLONOSCOPY WITH PROPOFOL (N/A) - 2:30pm ESOPHAGOGASTRODUODENOSCOPY (EGD) WITH PROPOFOL (N/A) as a surgical intervention.  The patient's history has been reviewed, patient examined, no change in status, stable for surgery.  I have reviewed the patient's chart and labs.  Questions were answered to the patient's satisfaction.     Christy Randall

## 2021-10-30 NOTE — Anesthesia Preprocedure Evaluation (Addendum)
Anesthesia Evaluation  Patient identified by MRN, date of birth, ID band Patient awake    Reviewed: Allergy & Precautions, H&P , NPO status , Patient's Chart, lab work & pertinent test results, reviewed documented beta blocker date and time   Airway Mallampati: II  TM Distance: >3 FB Neck ROM: full    Dental no notable dental hx.    Pulmonary sleep apnea , COPD, former smoker,    Pulmonary exam normal breath sounds clear to auscultation       Cardiovascular Exercise Tolerance: Good hypertension, negative cardio ROS   Rhythm:regular Rate:Normal     Neuro/Psych PSYCHIATRIC DISORDERS Anxiety negative neurological ROS     GI/Hepatic Neg liver ROS, GERD  Medicated,  Endo/Other  diabetes, Type 2Hypothyroidism Morbid obesity  Renal/GU negative Renal ROS  negative genitourinary   Musculoskeletal   Abdominal   Peds  Hematology negative hematology ROS (+)   Anesthesia Other Findings   Reproductive/Obstetrics negative OB ROS                            Anesthesia Physical Anesthesia Plan  ASA: 3  Anesthesia Plan: General   Post-op Pain Management:    Induction:   PONV Risk Score and Plan: Propofol infusion  Airway Management Planned:   Additional Equipment:   Intra-op Plan:   Post-operative Plan:   Informed Consent: I have reviewed the patients History and Physical, chart, labs and discussed the procedure including the risks, benefits and alternatives for the proposed anesthesia with the patient or authorized representative who has indicated his/her understanding and acceptance.     Dental Advisory Given  Plan Discussed with: CRNA  Anesthesia Plan Comments:         Anesthesia Quick Evaluation

## 2021-10-30 NOTE — Anesthesia Postprocedure Evaluation (Signed)
Anesthesia Post Note  Patient: SHARONLEE NINE  Procedure(s) Performed: COLONOSCOPY WITH PROPOFOL ESOPHAGOGASTRODUODENOSCOPY (EGD) WITH PROPOFOL BIOPSY POLYPECTOMY  Patient location during evaluation: Phase II Anesthesia Type: General Level of consciousness: awake Pain management: pain level controlled Vital Signs Assessment: post-procedure vital signs reviewed and stable Respiratory status: spontaneous breathing and respiratory function stable Cardiovascular status: blood pressure returned to baseline and stable Postop Assessment: no headache and no apparent nausea or vomiting Anesthetic complications: no Comments: Late entry   No notable events documented.   Last Vitals:  Vitals:   10/30/21 1029 10/30/21 1207  BP:  108/80  Pulse: (!) 52 60  Resp: 20 (!) 23  Temp: 36.5 C (!) 36.4 C  SpO2: 98% 98%    Last Pain:  Vitals:   10/30/21 1207  TempSrc: Oral  PainSc: 0-No pain                 Louann Sjogren

## 2021-11-03 LAB — SURGICAL PATHOLOGY

## 2021-11-06 ENCOUNTER — Encounter (HOSPITAL_COMMUNITY): Payer: Self-pay | Admitting: Internal Medicine

## 2021-11-07 ENCOUNTER — Other Ambulatory Visit (HOSPITAL_COMMUNITY): Payer: 59

## 2021-12-03 ENCOUNTER — Telehealth: Payer: Self-pay

## 2021-12-03 NOTE — Telephone Encounter (Signed)
Pt called stating that she is still having persistent diarrhea and that her pcp has ran out of options on how to get it to stop. Pt states that he recently tried her on colestipol and that did not help. Pt states that the only thing that helps is imodium and she read on the label that it cant be taken all the time. Pt is wanting to know what else she can do. Pt's pcp has ordered stool samples, she will be submitting those tomorrow.

## 2021-12-04 NOTE — Telephone Encounter (Signed)
Has she been placed on Lomotil before?  If her stool studies are negative, this would be the next option.  We can also try a course of Xifaxan as well.

## 2021-12-04 NOTE — Telephone Encounter (Signed)
Pt is dropping stool samples off now. Will contact us with results. Pt has not tried lomotil or xifaxan.

## 2022-01-10 ENCOUNTER — Encounter (HOSPITAL_COMMUNITY): Payer: Self-pay

## 2022-01-10 ENCOUNTER — Emergency Department (HOSPITAL_COMMUNITY)
Admission: EM | Admit: 2022-01-10 | Discharge: 2022-01-10 | Disposition: A | Discharge status: Home / Self Care | Payer: 59 | Attending: Emergency Medicine | Admitting: Emergency Medicine

## 2022-01-10 DIAGNOSIS — G8929 Other chronic pain: Secondary | ICD-10-CM | POA: Diagnosis not present

## 2022-01-10 DIAGNOSIS — M5441 Lumbago with sciatica, right side: Secondary | ICD-10-CM | POA: Insufficient documentation

## 2022-01-10 MED ORDER — IBUPROFEN 600 MG PO TABS: 600.0000 mg | ORAL_TABLET | Freq: Four times a day (QID) | ORAL | 0 refills | Status: DC | PRN

## 2022-01-10 MED ORDER — LIDOCAINE 5 % EX PTCH
1.0000 | MEDICATED_PATCH | CUTANEOUS | 0 refills | Status: DC
Start: 2022-01-10 — End: 2022-01-28

## 2022-01-10 MED ORDER — LIDOCAINE 5 % EX PTCH
1.0000 | MEDICATED_PATCH | CUTANEOUS | Status: DC
Start: 2022-01-10 — End: 2022-01-10
  Administered 2022-01-10: 1 via TRANSDERMAL
  Filled 2022-01-10: qty 1

## 2022-01-10 MED ORDER — KETOROLAC TROMETHAMINE 15 MG/ML IJ SOLN
15.0000 mg | Freq: Once | INTRAMUSCULAR | Status: AC
  Administered 2022-01-10: 15 mg via INTRAMUSCULAR
  Filled 2022-01-10: qty 1

## 2022-01-10 MED ORDER — OXYCODONE-ACETAMINOPHEN 5-325 MG PO TABS
2.0000 | ORAL_TABLET | Freq: Once | ORAL | Status: AC
  Administered 2022-01-10: 2 via ORAL
  Filled 2022-01-10: qty 2

## 2022-01-10 NOTE — ED Provider Notes (Signed)
[facility] EMERGENCY DEPARTMENT Provider Note   CSN: [id number] Arrival date & time: 01/10/22  1618     History Chief Complaint  Patient presents with   Back Pain    Christy Randall is a 56 y.o. female patient with history of chronic back pain who presents to the emergency department today for further evaluation of right sided back pain with radiation down the right leg.  This has been ongoing for couple weeks.  She was seen and evaluated by her chiropractor and PCP.  She has been trying conservative measures with little relief.  She denies any bowel or bladder incontinence.  No focal weakness or numbness to the lower extremities.  She describes the pain as a sharp shooting sensation.   Back Pain      Home Medications Prior to Admission medications   Medication Sig Start Date End Date Taking? Authorizing Provider  ibuprofen (ADVIL) 600 MG tablet Take 1 tablet (600 mg total) by mouth every 6 (six) hours as needed. 01/10/22  Yes Arbor Cohen M, PA-C  lidocaine (LIDODERM) 5 % Place 1 patch onto the skin daily. Remove & Discard patch within 12 hours or as directed by MD 01/10/22  Yes Raul Del, Roselyne Stalnaker M, PA-C  BREZTRI AEROSPHERE 160-9-4.8 MCG/ACT AERO Inhale 2 puffs into the lungs daily as needed (shortness of breath). 07/07/21   [provider]  Cholecalciferol (VITAMIN D3) 2000 units TABS Take 2,000 Units by mouth daily.    [provider]  lansoprazole (PREVACID) 30 MG capsule Take 30 mg by mouth daily at 12 noon.    [provider]  levothyroxine (SYNTHROID) 175 MCG tablet Take 175 mcg by mouth daily before breakfast.    [provider]  losartan (COZAAR) 100 MG tablet Take 100 mg by mouth daily.    [provider]  rosuvastatin (CRESTOR) 5 MG tablet Take 5 mg by mouth daily.    [provider]  Semaglutide (OZEMPIC, 0.25 OR 0.5 MG/DOSE, [place]) Inject 0.5 mg into the skin every 7 (seven) days.    [provider]  tiZANidine  (ZANAFLEX) 4 MG tablet Take 4 mg by mouth at bedtime as needed for muscle spasms.    [provider]      Allergies    Morphine and related    Review of Systems   Review of Systems  Musculoskeletal:  Positive for back pain.  All other systems reviewed and are negative.   Physical Exam Updated Vital Signs BP 137/70 (BP Location: Right Arm)   Pulse (!) 56   Temp 97.7 F (36.5 C) (Oral)   Resp 18   Ht '5\' 6"'$  (1.676 m)   Wt 120.2 kg   SpO2 98%   BMI 42.77 kg/m  Physical Exam Vitals and nursing note reviewed.  Constitutional:      General: She is not in acute distress.    Appearance: Normal appearance.  HENT:     Head: Normocephalic and atraumatic.  Eyes:     General:        Right eye: No discharge.        Left eye: No discharge.  Cardiovascular:     Comments: Regular rate and rhythm.  S1/S2 are distinct without any evidence of murmur, rubs, or gallops.  Radial pulses are 2+ bilaterally.  Dorsalis pedis pulses are 2+ bilaterally.  No evidence of pedal edema. Pulmonary:     Comments: Clear to auscultation bilaterally.  Normal effort.  No respiratory distress.  No evidence of  wheezes, rales, or rhonchi heard throughout. Abdominal:     General: Abdomen is flat. Bowel sounds are normal. There is no distension.     Tenderness: There is no abdominal tenderness. There is no guarding or rebound.  Musculoskeletal:        General: Normal range of motion.     Cervical back: Neck supple.     Comments: There is no tenderness along the midline lumbar spine.  There is point tenderness on the para lumbar musculature on the right.  Positive straight leg raise on the right.  5/5 strength to the lower extremities.  Normal sensation to lower extremities.  Skin:    General: Skin is warm and dry.     Findings: No rash.  Neurological:     General: No focal deficit present.     Mental Status: She is alert.  Psychiatric:        Mood and Affect: Mood normal.        Behavior: Behavior  normal.     ED Results / Procedures / Treatments   Labs (all labs ordered are listed, but only abnormal results are displayed) Labs Reviewed - No data to display  EKG None  Radiology No results found.  Procedures Procedures    Medications Ordered in ED Medications  lidocaine (LIDODERM) 5 % 1 patch (1 patch Transdermal Patch Applied 01/10/22 1709)  oxyCODONE-acetaminophen (PERCOCET/ROXICET) 5-325 MG per tablet 2 tablet (2 tablets Oral Given 01/10/22 1704)  ketorolac (TORADOL) 15 MG/ML injection 15 mg (15 mg Intramuscular Given 01/10/22 1705)    ED Course/ Medical Decision Making/ A&P Clinical Course as of 01/10/22 1805  Sat Jan 10, 2022  1801 Patient feeling immensely better after pain medications. [CF]    Clinical Course User Index [CF] Hendricks Limes, PA-C                           Medical Decision Making Christy Randall is a 56 y.o. female patient who presents to the emergency department for further evaluation of of right paralumbar back pain.  This seems to be consistent with sciatica.  Do not feel that imaging is warranted.  I will give her Toradol, Lidoderm patch, and Percocet.  I will plan to reassess.  She is in no acute distress at this time.  She does appear uncomfortable however.  Patient feeling much better after pain medications.  I will prescribe her ibuprofen to go home with to alternate with Tylenol.  I will also give her lidocaine patches.  She has a appointment with orthopedics early next month.  We will have her keep that appointment.  She was encouraged to return to the emergency department for any worsening symptoms.  She is safe for discharge at this time.   Risk Prescription drug management.   Final Clinical Impression(s) / ED Diagnoses Final diagnoses:  Acute right-sided low back pain with right-sided sciatica    Rx / DC Orders ED Discharge Orders          Ordered    ibuprofen (ADVIL) 600 MG tablet  Every 6 hours PRN        01/10/22 1801     lidocaine (LIDODERM) 5 %  Every 24 hours        01/10/22 1801              Hendricks Limes, PA-C 01/10/22 1805    Milton Ferguson, MD 01/12/22 1536

## 2022-01-10 NOTE — Discharge Instructions (Addendum)
Please take 600 mg ibuprofen every 6 hours as needed for pain.  You may alternate Tylenol every 3-4 hours.  Please follow-up with your orthopedist.  You may use Lidoderm patches as prescribed.  Return to the emergency department for any worsening symptoms.

## 2022-01-10 NOTE — ED Triage Notes (Signed)
Pt states she has been having back pain x 2 weeks on right lower side, going down her leg. Denies urinary issues. Denies any new injury, trauma, lifting.

## 2022-01-14 ENCOUNTER — Other Ambulatory Visit (HOSPITAL_COMMUNITY): Payer: Self-pay | Admitting: Internal Medicine

## 2022-01-14 ENCOUNTER — Other Ambulatory Visit: Payer: Self-pay | Admitting: Internal Medicine

## 2022-01-14 DIAGNOSIS — M545 Low back pain, unspecified: Secondary | ICD-10-CM

## 2022-01-20 ENCOUNTER — Ambulatory Visit (HOSPITAL_COMMUNITY)
Admission: RE | Admit: 2022-01-20 | Discharge: 2022-01-20 | Disposition: A | Discharge status: Home / Self Care | Payer: 59 | Source: Ambulatory Visit | Attending: Internal Medicine | Admitting: Internal Medicine

## 2022-01-20 DIAGNOSIS — M5136 Other intervertebral disc degeneration, lumbar region: Secondary | ICD-10-CM | POA: Insufficient documentation

## 2022-01-20 DIAGNOSIS — M545 Low back pain, unspecified: Secondary | ICD-10-CM | POA: Insufficient documentation

## 2022-01-28 ENCOUNTER — Ambulatory Visit (INDEPENDENT_AMBULATORY_CARE_PROVIDER_SITE_OTHER): Payer: 59 | Admitting: Internal Medicine

## 2022-01-28 ENCOUNTER — Encounter: Payer: Self-pay | Admitting: Internal Medicine

## 2022-01-28 VITALS — BP 147/95 | HR 62 | Temp 97.8°F | Ht 66.0 in | Wt 259.3 lb

## 2022-01-28 DIAGNOSIS — K219 Gastro-esophageal reflux disease without esophagitis: Secondary | ICD-10-CM

## 2022-01-28 DIAGNOSIS — D123 Benign neoplasm of transverse colon: Secondary | ICD-10-CM | POA: Diagnosis not present

## 2022-01-28 DIAGNOSIS — K580 Irritable bowel syndrome with diarrhea: Secondary | ICD-10-CM | POA: Diagnosis not present

## 2022-01-28 DIAGNOSIS — K589 Irritable bowel syndrome without diarrhea: Secondary | ICD-10-CM | POA: Insufficient documentation

## 2022-01-28 NOTE — Patient Instructions (Addendum)
For your chronic diarrhea, I want you to start taking Imodium more regularly.  I would start out once a day and see how you do.  You can titrate this to your symptoms.  If not improved, we will try a new medication called dicyclomine.  Continue on Prevacid for your chronic acid reflux.  We will plan on repeat colonoscopy 2026.  Follow-up with me in 6 weeks.  It was great seeing you again today.  Dr. Abbey Chatters

## 2022-01-28 NOTE — Progress Notes (Signed)
Primary Care Physician:  Monico Blitz, MD Primary Gastroenterologist:  Dr. Abbey Chatters  Chief Complaint  Patient presents with   Diarrhea    Diarrhea for 4 -5 months. Has tried [place], colestipol and another prescription med but unsure of the name. Imodium does help. When she takes imodium she will not have a stool for about 3 days.     HPI:   Christy Randall is a 56 y.o. female who presents today for follow-up visit.  History of chronic GERD historically well controlled on Prevacid.  Underwent EGD 10/30/2021 which showed gastritis.  Biopsies negative for H. pylori.  Currently without complaints in regards to her chronic GERD.  Also with diarrhea.  States she felt this was due to her metformin though this was discontinued and she continues to have issues with diarrhea.  Notes up to 5-7 loose stools daily.   Status post cholecystectomy 29 years ago.  Colonoscopy 10/30/2021 with 5 tubular adenomas removed.  Random colon biopsies negative for microscopic colitis.  Has been trialed on colestipol and cholestyramine without improvement in her symptoms.  Currently taking Imodium which she states helps.  She will take 2 Imodium and not have a bowel movement for 3 days at which point she will have hard bowel movement and subsequent washout.  Stool studies 10/16/2021 were positive for norovirus though she is continue to have issues with diarrhea.  Celiac panel negative.  TSH WNL.   Past Medical History:  Diagnosis Date   Anxiety    COPD (chronic obstructive pulmonary disease) (HCC)    Degenerative arthritis of lumbar spine    Diabetes mellitus without complication (HCC)    Frequent urination    GERD (gastroesophageal reflux disease)    Hypertension    Hypothyroid    Nerve pain    bilateral feet   Nocturia    OSA on CPAP    Osteoarthritis    Urgency of urination    Wears glasses     Past Surgical History:  Procedure Laterality Date   BIOPSY  10/30/2021   Procedure: BIOPSY;   Surgeon: Eloise Harman, DO;  [facility]: [facility];  Service: Endoscopy;;   CESAREAN SECTION  1992   COLONOSCOPY     COLONOSCOPY WITH PROPOFOL N/A 10/30/2021   Procedure: COLONOSCOPY WITH PROPOFOL;  Surgeon: Eloise Harman, DO;  Location: [facility];  Service: Endoscopy;  Laterality: N/A;  2:30pm   CYSTOSCOPY WITH HYDRODISTENSION AND BIOPSY N/A 05/13/2017   Procedure: CYSTOSCOPY Adriana Mccallum INSTILL MARCAINE AND PYRIDIUM;  Surgeon: Irine Seal, MD;  Location: [facility];  Service: Urology;  Laterality: N/A;   ESOPHAGOGASTRODUODENOSCOPY (EGD) WITH PROPOFOL N/A 10/30/2021   Procedure: ESOPHAGOGASTRODUODENOSCOPY (EGD) WITH PROPOFOL;  Surgeon: Eloise Harman, DO;  [facility]: [facility];  Service: Endoscopy;  Laterality: N/A;   LAPAROSCOPIC CHOLECYSTECTOMY  1992   POLYPECTOMY  10/30/2021   Procedure: POLYPECTOMY;  Surgeon: Eloise Harman, DO;  [facility]: [facility];  Service: Endoscopy;;   TOTAL KNEE ARTHROPLASTY Left 11/02/2016   Procedure: TOTAL KNEE ARTHROPLASTY;  Surgeon: Elsie Saas, MD;  Location: [place];  Service: Orthopedics;  Laterality: Left;   TRANSTHORACIC ECHOCARDIOGRAM  10/01/2016   ef 74-08%, grade 1 diastolic dysfunction/  trivial MR and TR   TUBAL LIGATION Bilateral 1995    Current Outpatient Medications  Medication Sig Dispense Refill   Cholecalciferol (VITAMIN D3) 2000 units TABS Take 2,000 Units by mouth daily.     ibuprofen (ADVIL) 600 MG tablet Take 1 tablet (  600 mg total) by mouth every 6 (six) hours as needed. 30 tablet 0   levothyroxine (SYNTHROID) 175 MCG tablet Take 175 mcg by mouth daily before breakfast.     losartan (COZAAR) 100 MG tablet Take 100 mg by mouth daily.     pregabalin (LYRICA) 50 MG capsule Take 50 mg by mouth 2 (two) times daily.     rosuvastatin (CRESTOR) 5 MG tablet Take 5 mg by mouth daily.     Semaglutide (OZEMPIC, 0.25 OR 0.5 MG/DOSE, [place]) Inject 0.5 mg into the skin every 7 (seven) days.     tiZANidine  (ZANAFLEX) 4 MG tablet Take 4 mg by mouth at bedtime as needed for muscle spasms.     traMADol (ULTRAM) 50 MG tablet Take 50 mg by mouth 3 (three) times daily.     varenicline (CHANTIX PAK) 0.5 MG X 11 & 1 MG X 42 tablet Take by mouth as directed.     amitriptyline (ELAVIL) 10 MG tablet Take 10 mg by mouth at bedtime.     No current facility-administered medications for this visit.    Allergies as of 01/28/2022 - Review Complete 01/10/2022  Allergen Reaction Noted   Morphine and related Other (See Comments)     Family History  Problem Relation Age of Onset   Diabetes Mother     Social History   Socioeconomic History   Marital status: Married    Spouse name: Not on file   Number of children: Not on file   Years of education: Not on file   Highest education level: Not on file  Occupational History   Not on file  Tobacco Use   Smoking status: Every Day    Packs/day: 0.25    Years: 37.00    Total pack years: 9.25    Types: Cigarettes    Start date: 11/19/1986    Passive exposure: Current   Smokeless tobacco: Never  Vaping Use   Vaping Use: Former  Substance and Sexual Activity   Alcohol use: No   Drug use: No   Sexual activity: Not on file  Other Topics Concern   Not on file  Social History Narrative   Not on file   Social Determinants of Health   Financial Resource Strain: Not on file  Food Insecurity: Not on file  Transportation Needs: Not on file  Physical Activity: Not on file  Stress: Not on file  Social Connections: Not on file  Intimate Partner Violence: Not on file    Subjective: Review of Systems  Constitutional:  Negative for chills and fever.  HENT:  Negative for congestion and hearing loss.   Eyes:  Negative for blurred vision and double vision.  Respiratory:  Negative for cough and shortness of breath.   Cardiovascular:  Negative for chest pain and palpitations.  Gastrointestinal:  Positive for abdominal pain and diarrhea. Negative for blood  in stool, constipation, heartburn, melena and vomiting.  Genitourinary:  Negative for dysuria and urgency.  Musculoskeletal:  Negative for joint pain and myalgias.  Skin:  Negative for itching and rash.  Neurological:  Negative for dizziness and headaches.  Psychiatric/Behavioral:  Negative for depression. The patient is not nervous/anxious.        Objective: BP (!) 147/95 (BP Location: Right Arm, Patient Position: Sitting, Cuff Size: Large)   Pulse 62   Temp 97.8 F (36.6 C) (Oral)   Ht '5\' 6"'$  (1.676 m)   Wt 259 lb 4.8 oz (117.6 kg)   BMI 41.85 kg/m  Physical Exam Constitutional:      Appearance: Normal appearance. She is obese.  HENT:     Head: Normocephalic and atraumatic.  Eyes:     Extraocular Movements: Extraocular movements intact.     Conjunctiva/sclera: Conjunctivae normal.  Cardiovascular:     Rate and Rhythm: Normal rate and regular rhythm.  Pulmonary:     Effort: Pulmonary effort is normal.     Breath sounds: Normal breath sounds.  Abdominal:     General: Bowel sounds are normal.     Palpations: Abdomen is soft.     Tenderness: There is abdominal tenderness in the left upper quadrant.  Musculoskeletal:        General: No swelling. Normal range of motion.     Cervical back: Normal range of motion and neck supple.  Skin:    General: Skin is warm and dry.     Coloration: Skin is not jaundiced.  Neurological:     General: No focal deficit present.     Mental Status: She is alert and oriented to person, place, and time.  Psychiatric:        Mood and Affect: Mood normal.        Behavior: Behavior normal.      Assessment: *GERD-relatively well controlled  *Irritable bowel syndrome with diarrhea *Adenomatous colon polyps  Plan: GERD currently well controlled.  Continue current management.  Chronic diarrhea likely irritable bowel syndrome.  Has trialed and failed cholestyramine, colestipol.  Imodium does seem to help.  I recommend that she start taking  this every day 1 tablet daily and see how she does.  She can titrate further to avoid constipation.  Colonoscopy recall 2026  Follow-up in 6 weeks, if not improved, consider trial of dicyclomine.   01/28/2022 1:39 PM   Disclaimer: This note was dictated with voice recognition software. Similar sounding words can inadvertently be transcribed and may not be corrected upon review.

## 2022-02-04 DIAGNOSIS — M5451 Vertebrogenic low back pain: Secondary | ICD-10-CM | POA: Diagnosis not present

## 2022-02-04 DIAGNOSIS — M5416 Radiculopathy, lumbar region: Secondary | ICD-10-CM | POA: Diagnosis not present

## 2022-02-06 DIAGNOSIS — M47816 Spondylosis without myelopathy or radiculopathy, lumbar region: Secondary | ICD-10-CM | POA: Diagnosis not present

## 2022-02-06 DIAGNOSIS — M533 Sacrococcygeal disorders, not elsewhere classified: Secondary | ICD-10-CM | POA: Diagnosis not present

## 2022-02-06 DIAGNOSIS — M5416 Radiculopathy, lumbar region: Secondary | ICD-10-CM | POA: Diagnosis not present

## 2022-02-26 ENCOUNTER — Other Ambulatory Visit: Payer: Self-pay

## 2022-02-26 DIAGNOSIS — Z122 Encounter for screening for malignant neoplasm of respiratory organs: Secondary | ICD-10-CM

## 2022-02-26 DIAGNOSIS — Z87891 Personal history of nicotine dependence: Secondary | ICD-10-CM

## 2022-02-26 NOTE — Progress Notes (Signed)
Order placed for LDCT. Patient scheduled for LDCT on 04/09/22 at 1445. Patient aware.

## 2022-03-11 DIAGNOSIS — R350 Frequency of micturition: Secondary | ICD-10-CM | POA: Diagnosis not present

## 2022-03-11 DIAGNOSIS — N390 Urinary tract infection, site not specified: Secondary | ICD-10-CM | POA: Diagnosis not present

## 2022-03-16 DIAGNOSIS — R0602 Shortness of breath: Secondary | ICD-10-CM | POA: Diagnosis not present

## 2022-03-23 ENCOUNTER — Encounter (INDEPENDENT_AMBULATORY_CARE_PROVIDER_SITE_OTHER): Payer: Self-pay

## 2022-04-09 ENCOUNTER — Ambulatory Visit (HOSPITAL_COMMUNITY): Admission: RE | Admit: 2022-04-09 | Payer: 59 | Source: Ambulatory Visit

## 2022-04-20 ENCOUNTER — Other Ambulatory Visit: Payer: Self-pay | Admitting: Internal Medicine

## 2022-04-20 DIAGNOSIS — Z1231 Encounter for screening mammogram for malignant neoplasm of breast: Secondary | ICD-10-CM

## 2022-05-06 ENCOUNTER — Ambulatory Visit
Admission: RE | Admit: 2022-05-06 | Discharge: 2022-05-06 | Disposition: A | Discharge status: Home / Self Care | Payer: 59 | Source: Ambulatory Visit | Attending: Internal Medicine | Admitting: Internal Medicine

## 2022-05-06 DIAGNOSIS — Z1231 Encounter for screening mammogram for malignant neoplasm of breast: Secondary | ICD-10-CM

## 2022-05-13 DIAGNOSIS — R110 Nausea: Secondary | ICD-10-CM | POA: Diagnosis not present

## 2022-05-13 DIAGNOSIS — J111 Influenza due to unidentified influenza virus with other respiratory manifestations: Secondary | ICD-10-CM | POA: Diagnosis not present

## 2022-05-28 ENCOUNTER — Other Ambulatory Visit: Payer: Self-pay

## 2022-05-28 DIAGNOSIS — Z122 Encounter for screening for malignant neoplasm of respiratory organs: Secondary | ICD-10-CM

## 2022-05-28 DIAGNOSIS — Z87891 Personal history of nicotine dependence: Secondary | ICD-10-CM

## 2022-05-28 NOTE — Progress Notes (Signed)
Patient rescheduled for LDCT on 01/31 at 1320. Patient aware.

## 2022-06-23 ENCOUNTER — Encounter: Payer: Self-pay | Admitting: Physician Assistant

## 2022-06-24 ENCOUNTER — Inpatient Hospital Stay: Admission: RE | Admit: 2022-06-24 | Payer: 59 | Source: Ambulatory Visit

## 2022-07-27 DIAGNOSIS — Z79899 Other long term (current) drug therapy: Secondary | ICD-10-CM | POA: Diagnosis not present

## 2022-07-27 DIAGNOSIS — Z0000 Encounter for general adult medical examination without abnormal findings: Secondary | ICD-10-CM | POA: Diagnosis not present

## 2022-07-27 DIAGNOSIS — E039 Hypothyroidism, unspecified: Secondary | ICD-10-CM | POA: Diagnosis not present

## 2022-07-27 DIAGNOSIS — E7800 Pure hypercholesterolemia, unspecified: Secondary | ICD-10-CM | POA: Diagnosis not present

## 2022-07-28 DIAGNOSIS — J811 Chronic pulmonary edema: Secondary | ICD-10-CM | POA: Diagnosis not present

## 2022-07-28 DIAGNOSIS — R0602 Shortness of breath: Secondary | ICD-10-CM | POA: Diagnosis not present

## 2022-07-28 DIAGNOSIS — R609 Edema, unspecified: Secondary | ICD-10-CM | POA: Diagnosis not present

## 2022-07-28 DIAGNOSIS — E039 Hypothyroidism, unspecified: Secondary | ICD-10-CM | POA: Diagnosis not present

## 2022-08-05 ENCOUNTER — Ambulatory Visit
Admission: RE | Admit: 2022-08-05 | Discharge: 2022-08-05 | Disposition: A | Discharge status: Home / Self Care | Payer: 59 | Source: Ambulatory Visit | Attending: Physician Assistant | Admitting: Physician Assistant

## 2022-08-05 DIAGNOSIS — F17210 Nicotine dependence, cigarettes, uncomplicated: Secondary | ICD-10-CM | POA: Diagnosis not present

## 2022-08-05 DIAGNOSIS — Z87891 Personal history of nicotine dependence: Secondary | ICD-10-CM

## 2022-08-05 DIAGNOSIS — Z122 Encounter for screening for malignant neoplasm of respiratory organs: Secondary | ICD-10-CM

## 2022-08-10 ENCOUNTER — Encounter (INDEPENDENT_AMBULATORY_CARE_PROVIDER_SITE_OTHER): Payer: Self-pay | Admitting: Family Medicine

## 2022-08-10 ENCOUNTER — Ambulatory Visit (INDEPENDENT_AMBULATORY_CARE_PROVIDER_SITE_OTHER): Payer: 59 | Admitting: Family Medicine

## 2022-08-10 VITALS — BP 121/68 | HR 73 | Temp 97.8°F | Ht 65.0 in | Wt 264.0 lb

## 2022-08-10 DIAGNOSIS — E1169 Type 2 diabetes mellitus with other specified complication: Secondary | ICD-10-CM | POA: Diagnosis not present

## 2022-08-10 DIAGNOSIS — Z6841 Body Mass Index (BMI) 40.0 and over, adult: Secondary | ICD-10-CM | POA: Diagnosis not present

## 2022-08-10 DIAGNOSIS — Z0289 Encounter for other administrative examinations: Secondary | ICD-10-CM

## 2022-08-10 DIAGNOSIS — Z7985 Long-term (current) use of injectable non-insulin antidiabetic drugs: Secondary | ICD-10-CM | POA: Diagnosis not present

## 2022-08-10 DIAGNOSIS — E119 Type 2 diabetes mellitus without complications: Secondary | ICD-10-CM | POA: Insufficient documentation

## 2022-08-11 ENCOUNTER — Telehealth (INDEPENDENT_AMBULATORY_CARE_PROVIDER_SITE_OTHER): Payer: Self-pay | Admitting: *Deleted

## 2022-08-11 NOTE — Telephone Encounter (Signed)
Called left message for patient to call back and confirm appointment changes. Patient's appointment is now scheduled for 08/24/2022 @ 8:40 NEW PATIENT appointment, please arrive 1 HOUR early. Two week new patient follow up appointment is scheduled for 09/07/2022 @ 10:40.

## 2022-08-17 ENCOUNTER — Encounter (INDEPENDENT_AMBULATORY_CARE_PROVIDER_SITE_OTHER): Payer: Self-pay | Admitting: Family Medicine

## 2022-08-17 NOTE — Progress Notes (Unsigned)
Office: [phone]  /  Fax: [phone]   Initial Visit  Christy Randall was seen in clinic today to evaluate for obesity. She is interested in losing weight to improve overall health and reduce the risk of weight related complications. She presents today to review program treatment options, initial physical assessment, and evaluation.     She was referred by: Specialist ([facility])  When asked what else they would like to accomplish? She states: Adopt healthier eating patterns, Improve existing medical conditions, and Improve quality of life  When asked how has your weight affected you? She states: Contributed to medical problems, Contributed to orthopedic problems or mobility issues, Having fatigue, and Having poor endurance  Some associated conditions: Hypertension, Arthritis:bilateral knee, Hyperlipidemia, and Diabetes  Contributing factors: Stress and Reduced physical activity  Current nutrition plan: None  Current level of physical activity: None  Current or previous pharmacotherapy: GLP-1(diabetes mellitus)   Past medical history includes:   Past Medical History:  Diagnosis Date   Anxiety    COPD (chronic obstructive pulmonary disease) (HCC)    Degenerative arthritis of lumbar spine    Diabetes mellitus without complication (HCC)    Frequent urination    GERD (gastroesophageal reflux disease)    Hypertension    Hypothyroid    Nerve pain    bilateral feet   Nocturia    OSA on CPAP    Osteoarthritis    Urgency of urination    Wears glasses      Objective:   BP 121/68   Pulse 73   Temp 97.8 F (36.6 C)   Ht 5\' 5"  (1.651 m)   Wt 264 lb (119.7 kg)   SpO2 95%   BMI 43.93 kg/m  She was weighed on the bioimpedance scale: Body mass index is 43.93 kg/m. Visceral Fat Rating:17, Body Fat%:50.2.  General:  Alert, oriented and cooperative. Patient is in no acute distress.  Respiratory: Normal respiratory effort, no problems with respiration noted   Extremities: Normal range of motion.    Mental Status: Normal mood and affect. Normal behavior. Normal judgment and thought content.   Assessment and Plan:  1. Type 2 diabetes mellitus with other specified complication, without long-term current use of insulin (HCC) Christy Randall is on Trulicity. Her PCP changed her to [facility], but there is a medication shortage and she was unable to get this. She notes polyphagia and weight gain with her diabetes mellitus.   Christy Randall and I discussed other medication options which might be covered by her insurance. We will start with a work-up and eating plan at her next visit while fasting.   - Hemoglobin A1c  2. Class 3 severe obesity with serious comorbidity and body mass index (BMI) of 40.0 to 44.9 in adult, unspecified obesity type (Christy Randall) We reviewed weight, biometrics, associated medical conditions and contributing factors with patient. She would benefit from weight loss therapy via a modified calorie, low-carb, high-protein nutritional plan tailored to their REE (resting energy expenditure) which will be determined by indirect calorimetry.  We will also assess for cardiometabolic risk and nutritional derangements via fasting serologies at her next appointment.      Obesity Treatment / Action Plan:  Will complete provided nutritional and psychosocial assessment questionnaire before the next appointment. Will be scheduled for indirect calorimetry to determine resting energy expenditure in a fasting state.  This will allow [place] to create a reduced calorie, high-protein meal plan to promote loss of fat mass while preserving muscle mass. Counseled on the health  benefits of losing 5%-15% of total body weight. Was counseled on pharmacotherapy and role as an adjunct in weight management.   Obesity Education Performed Today:  She was weighed on the bioimpedance scale and results were discussed and documented in the synopsis.  We discussed obesity as a disease and the  importance of a more detailed evaluation of all the factors contributing to the disease.  We discussed the importance of long term lifestyle changes which include nutrition, exercise and behavioral modifications as well as the importance of customizing this to her specific health and social needs.  We discussed the benefits of reaching a healthier weight to alleviate the symptoms of existing conditions and reduce the risks of the biomechanical, metabolic and psychological effects of obesity.  Christy Randall appears to be in the action stage of change and states they are ready to start intensive lifestyle modifications and behavioral modifications.   Reviewed by clinician on day of visit: allergies, medications, problem list, medical history, surgical history, family history, social history, and previous encounter notes.  I have personally spent 45 minutes total time today in preparation, patient care, and documentation for this visit, including the following: review of clinical lab tests; review of medical tests/procedures/services.   I, Trixie Dredge, am acting as transcriptionist for Dennard Nip, MD   I have reviewed the above documentation for accuracy and completeness, and I agree with the above. Dennard Nip, MD

## 2022-08-24 ENCOUNTER — Encounter (INDEPENDENT_AMBULATORY_CARE_PROVIDER_SITE_OTHER): Payer: Self-pay | Admitting: Family Medicine

## 2022-08-24 ENCOUNTER — Ambulatory Visit (INDEPENDENT_AMBULATORY_CARE_PROVIDER_SITE_OTHER): Payer: 59 | Admitting: Family Medicine

## 2022-08-24 VITALS — BP 110/70 | HR 64 | Temp 97.9°F | Ht 65.0 in | Wt 267.0 lb

## 2022-08-24 DIAGNOSIS — Z1331 Encounter for screening for depression: Secondary | ICD-10-CM

## 2022-08-24 DIAGNOSIS — E559 Vitamin D deficiency, unspecified: Secondary | ICD-10-CM | POA: Diagnosis not present

## 2022-08-24 DIAGNOSIS — Z7985 Long-term (current) use of injectable non-insulin antidiabetic drugs: Secondary | ICD-10-CM | POA: Diagnosis not present

## 2022-08-24 DIAGNOSIS — R5383 Other fatigue: Secondary | ICD-10-CM

## 2022-08-24 DIAGNOSIS — E039 Hypothyroidism, unspecified: Secondary | ICD-10-CM

## 2022-08-24 DIAGNOSIS — E782 Mixed hyperlipidemia: Secondary | ICD-10-CM | POA: Insufficient documentation

## 2022-08-24 DIAGNOSIS — E1169 Type 2 diabetes mellitus with other specified complication: Secondary | ICD-10-CM | POA: Diagnosis not present

## 2022-08-24 DIAGNOSIS — R0602 Shortness of breath: Secondary | ICD-10-CM

## 2022-08-24 DIAGNOSIS — Z6841 Body Mass Index (BMI) 40.0 and over, adult: Secondary | ICD-10-CM | POA: Diagnosis not present

## 2022-08-24 NOTE — Progress Notes (Unsigned)
Chief Complaint:   OBESITY Christy Randall (MR# [id number]) is a 57 y.o. female who presents for evaluation and treatment of obesity and related comorbidities. Current BMI is Body mass index is 44.43 kg/m. Christy Randall has been struggling with her weight for many years and has been unsuccessful in either losing weight, maintaining weight loss, or reaching her healthy weight goal.  Christy Randall is currently in the action stage of change and ready to dedicate time achieving and maintaining a healthier weight. Christy Randall is interested in becoming our patient and working on intensive lifestyle modifications including (but not limited to) diet and exercise for weight loss.  Christy Randall is ready to work on her weight loss, and she is not interested in weight loss surgery.   Christy Randall's habits were reviewed today and are as follows: she struggles with family and or coworkers weight loss sabotage, her desired weight loss is 87 lbs, she has been heavy most of her life, she started gaining weight in 1992, her heaviest weight ever was 297 pounds, she has significant food cravings issues, she skips meals frequently, she is frequently drinking liquids with calories, she frequently makes poor food choices, she has problems with excessive hunger, she frequently eats larger portions than normal, and she struggles with emotional eating.  Depression Screen Christy Randall's Food and Mood (modified PHQ-9) score was 25.  Subjective:   1. Other fatigue Fashionette admits to daytime somnolence and admits to waking up still tired. Patient has a history of symptoms of daytime fatigue and morning fatigue. Sherna generally gets 5 or 6 hours of sleep per night, and states that she has nightime awakenings. Snoring is present. Apneic episodes are present. Epworth Sleepiness Score is 14.   2. SOBOE (shortness of breath on exertion) Christy Randall notes increasing shortness of breath with exercising and seems to be worsening over time with weight gain. She notes getting  out of breath sooner with activity than she used to. This has not gotten worse recently. Ariam denies shortness of breath at rest or orthopnea.  3. Type 2 diabetes mellitus with other specified complication, without long-term current use of insulin Christy Randall is followed by her PCP at [facility]  She has no recent lab results in epic.  Her last HgA1c in 2018 was 6.6.  4. Mixed hyperlipidemia Christy Randall is on Crestor 5 mg, and she is ready to work on her diet to improve her cholesterol numbers.  She denies chest pain or myalgias.  5. Vitamin D deficiency Christy Randall is on a OTC vitamin D 2000 units daily, and she notes fatigue.  6. Acquired hypothyroidism Christy Randall is on Synthroid and her dose was decreased from 175 mcg to 125 mcg approximately 1 month ago.  Assessment/Plan:   1. Other fatigue Christy Randall does feel that her weight is causing her energy to be lower than it should be. Fatigue may be related to obesity, depression or many other causes. Labs will be ordered, and in the meanwhile, Pinkey will focus on self care including making healthy food choices, increasing physical activity and focusing on stress reduction.  - EKG 12-Lead  2. SOBOE (shortness of breath on exertion) Christy Randall does feel that she gets out of breath more easily that she used to when she exercises. Christy Randall's shortness of breath appears to be obesity related and exercise induced. She has agreed to work on weight loss and gradually increase exercise to treat her exercise induced shortness of breath. Will continue to monitor closely.  3. Type 2 diabetes mellitus with  other specified complication, without long-term current use of insulin We will check labs today.  Christy Randall will start her eating plan, and we will follow-up at her next visit in 2 weeks.  - Vitamin B12 - CMP14+EGFR - Insulin, random - Hemoglobin A1c  4. Mixed hyperlipidemia We will check labs today.  Christy Randall will continue Crestor at 5 mg, and she will start her eating plan.  - CBC with  Differential/Platelet - Lipid Panel With LDL/HDL Ratio  5. Vitamin D deficiency We will check labs today, and we will [facility] at [facility] visit.  - VITAMIN D 25 Hydroxy (Vit-D Deficiency, Fractures)  6. Acquired hypothyroidism We will check labs today.  Recognizing the level may not be at goal for her dose.  - TSH  7. Depression screening Christy Randall had a positive depression screening. Depression is commonly associated with obesity and often results in emotional eating behaviors. We will monitor this closely and work on CBT to help improve the non-hunger eating patterns. Referral to Psychology may be required if no improvement is seen as she continues in our clinic.  8. Class 3 severe obesity with serious comorbidity and body mass index (BMI) of 40.0 to 44.9 in adult, unspecified obesity type Christy Randall is currently in the action stage of change and her goal is to continue with weight loss efforts. I recommend Christy Randall begin the structured treatment plan as follows:  She has agreed to the Category 3 Plan.  Exercise goals: No exercise has been prescribed for now, while we concentrate on nutritional changes.   Behavioral modification strategies: increasing lean protein intake and decreasing simple carbohydrates.  She was informed of the importance of frequent follow-up visits to maximize her success with intensive lifestyle modifications for her multiple health conditions. She was informed we would discuss her lab results at her next visit unless there is a critical issue that needs to be addressed sooner. Christy Randall agreed to keep her next visit at the agreed upon time to discuss these results.  Objective:   Blood pressure 110/70, pulse 64, temperature 97.9 F (36.6 C), height 5\' 5"  (1.651 m), weight 267 lb (121.1 kg), SpO2 92 %. Body mass index is 44.43 kg/m.  EKG: Normal sinus rhythm, rate 60 BPM.  Indirect Calorimeter completed today shows a VO2 of 287 and a REE of 1987.  Her calculated  basal metabolic rate is [id number] thus her basal metabolic rate is better than expected.  General: Cooperative, alert, well developed, in no acute distress. HEENT: Conjunctivae and lids unremarkable. Cardiovascular: Regular rhythm.  Lungs: Normal work of breathing. Neurologic: No focal deficits.   Lab Results  Component Value Date   CREATININE 0.97 10/29/2021   BUN 16 10/29/2021   NA 137 10/29/2021   K 3.9 10/29/2021   CL 106 10/29/2021   CO2 25 10/29/2021   No results found for: "ALT", "AST", "GGT", "ALKPHOS", "BILITOT" Lab Results  Component Value Date   HGBA1C 6.6 (H) 11/04/2016   No results found for: "INSULIN" Lab Results  Component Value Date   TSH 0.464 10/15/2021   No results found for: "CHOL", "HDL", "LDLCALC", "LDLDIRECT", "TRIG", "CHOLHDL" Lab Results  Component Value Date   WBC 18.2 (H) 11/04/2016   HGB 12.7 05/13/2017   HCT 35.1 (L) 11/04/2016   MCV 95.6 11/04/2016   PLT 255 11/04/2016   No results found for: "IRON", "TIBC", "FERRITIN"  Attestation Statements:   Reviewed by clinician on day of visit: allergies, medications, problem list, medical history, surgical history, family history, social  history, and previous encounter notes.  I have personally spent 40 minutes total time today in preparation, patient care, and documentation for this visit, including the following: review of clinical lab tests; review of medical tests/procedures/services.   I, Trixie Dredge, am acting as transcriptionist for Dennard Nip, MD.  I have reviewed the above documentation for accuracy and completeness, and I agree with the above. - Dennard Nip, MD

## 2022-08-25 LAB — CMP14+EGFR
ALT: 13 IU/L (ref 0–32)
AST: 17 IU/L (ref 0–40)
Albumin/Globulin Ratio: 1.8 (ref 1.2–2.2)
Albumin: 4.2 g/dL (ref 3.8–4.9)
Alkaline Phosphatase: 106 IU/L (ref 44–121)
BUN/Creatinine Ratio: 21 (ref 9–23)
BUN: 21 mg/dL (ref 6–24)
Bilirubin Total: 0.4 mg/dL (ref 0.0–1.2)
CO2: 28 mmol/L (ref 20–29)
Calcium: 9.6 mg/dL (ref 8.7–10.2)
Chloride: 97 mmol/L (ref 96–106)
Creatinine, Ser: 1.02 mg/dL — ABNORMAL HIGH (ref 0.57–1.00)
Globulin, Total: 2.4 g/dL (ref 1.5–4.5)
Glucose: 110 mg/dL — ABNORMAL HIGH (ref 70–99)
Potassium: 4.1 mmol/L (ref 3.5–5.2)
Sodium: 140 mmol/L (ref 134–144)
Total Protein: 6.6 g/dL (ref 6.0–8.5)
eGFR: 64 mL/min/{1.73_m2} (ref >59)

## 2022-08-25 LAB — CBC WITH DIFFERENTIAL/PLATELET
Basophils Absolute: 0 10*3/uL (ref 0.0–0.2)
Basos: 1 %
EOS (ABSOLUTE): 0.3 10*3/uL (ref 0.0–0.4)
Eos: 3 %
Hematocrit: 39 % (ref 34.0–46.6)
Hemoglobin: 12.8 g/dL (ref 11.1–15.9)
Immature Grans (Abs): 0 10*3/uL (ref 0.0–0.1)
Immature Granulocytes: 0 %
Lymphocytes Absolute: 2 10*3/uL (ref 0.7–3.1)
Lymphs: 25 %
MCH: 31 pg (ref 26.6–33.0)
MCHC: 32.8 g/dL (ref 31.5–35.7)
MCV: 94 fL (ref 79–97)
Monocytes Absolute: 0.7 10*3/uL (ref 0.1–0.9)
Monocytes: 9 %
Neutrophils Absolute: 5.1 10*3/uL (ref 1.4–7.0)
Neutrophils: 62 %
Platelets: 255 10*3/uL (ref 150–450)
RBC: 4.13 x10E6/uL (ref 3.77–5.28)
RDW: 12.9 % (ref 11.7–15.4)
WBC: 8.2 10*3/uL (ref 3.4–10.8)

## 2022-08-25 LAB — HEMOGLOBIN A1C
Est. average glucose Bld gHb Est-mCnc: 146 mg/dL
Hgb A1c MFr Bld: 6.7 % — ABNORMAL HIGH (ref 4.8–5.6)

## 2022-08-25 LAB — TSH: TSH: 1.36 u[IU]/mL (ref 0.450–4.500)

## 2022-08-25 LAB — INSULIN, RANDOM: INSULIN: 38.3 u[IU]/mL — ABNORMAL HIGH (ref 2.6–24.9)

## 2022-08-25 LAB — LIPID PANEL WITH LDL/HDL RATIO
Cholesterol, Total: 150 mg/dL (ref 100–199)
HDL: 36 mg/dL — ABNORMAL LOW (ref >39)
LDL Chol Calc (NIH): 92 mg/dL (ref 0–99)
LDL/HDL Ratio: 2.6 ratio (ref 0.0–3.2)
Triglycerides: 121 mg/dL (ref 0–149)
VLDL Cholesterol Cal: 22 mg/dL (ref 5–40)

## 2022-08-25 LAB — VITAMIN B12: Vitamin B-12: >2000 pg/mL — ABNORMAL HIGH (ref 232–1245)

## 2022-08-25 LAB — VITAMIN D 25 HYDROXY (VIT D DEFICIENCY, FRACTURES): Vit D, 25-Hydroxy: 27.1 ng/mL — ABNORMAL LOW (ref 30.0–100.0)

## 2022-08-28 NOTE — Progress Notes (Signed)
Patient notified of LDCT Lung Cancer Screening Results via mail with the recommendation to follow-up in 12 months. Patient's referring provider has been sent a copy of results. Results are as follows:   IMPRESSION: 1. Lung-RADS 2, benign appearance or behavior. Continue annual screening with low-dose chest CT without contrast in 12 months. 2. One vessel coronary atherosclerosis. 3. Aortic Atherosclerosis (ICD10-I70.0) and Emphysema (ICD10-J43.9). 

## 2022-09-07 ENCOUNTER — Ambulatory Visit (INDEPENDENT_AMBULATORY_CARE_PROVIDER_SITE_OTHER): Payer: Self-pay | Admitting: Family Medicine

## 2022-09-09 ENCOUNTER — Ambulatory Visit (INDEPENDENT_AMBULATORY_CARE_PROVIDER_SITE_OTHER): Payer: 59 | Admitting: Family Medicine

## 2022-09-09 ENCOUNTER — Encounter (INDEPENDENT_AMBULATORY_CARE_PROVIDER_SITE_OTHER): Payer: Self-pay | Admitting: Family Medicine

## 2022-09-09 VITALS — BP 116/73 | HR 66 | Temp 98.0°F | Ht 65.0 in | Wt 262.0 lb

## 2022-09-09 DIAGNOSIS — E559 Vitamin D deficiency, unspecified: Secondary | ICD-10-CM | POA: Diagnosis not present

## 2022-09-09 DIAGNOSIS — Z7985 Long-term (current) use of injectable non-insulin antidiabetic drugs: Secondary | ICD-10-CM

## 2022-09-09 DIAGNOSIS — E669 Obesity, unspecified: Secondary | ICD-10-CM

## 2022-09-09 DIAGNOSIS — E1169 Type 2 diabetes mellitus with other specified complication: Secondary | ICD-10-CM | POA: Diagnosis not present

## 2022-09-09 DIAGNOSIS — Z6841 Body Mass Index (BMI) 40.0 and over, adult: Secondary | ICD-10-CM

## 2022-09-09 MED ORDER — VITAMIN D (ERGOCALCIFEROL) 1.25 MG (50000 UNIT) PO CAPS
50000.0000 [IU] | ORAL_CAPSULE | ORAL | 0 refills | Status: DC
Start: 2022-09-09 — End: 2022-09-23

## 2022-09-09 NOTE — Progress Notes (Unsigned)
Chief Complaint:   OBESITY Christy Randall is here to discuss her progress with her obesity treatment plan along with follow-up of her obesity related diagnoses. Christy Randall is on the Category 3 Plan and states she is following her eating plan approximately 50% of the time. Christy Randall states she is doing 0 minutes 0 times per week.  Today's visit was #: 2 Starting weight: 267 lbs Starting date: 08/24/2022 Today's weight: 262 lbs Today's date: 09/09/2022 Total lbs lost to date: 5 Total lbs lost since last in-office visit: 5  Interim History: Christy Randall had a lot of challenging times the last 2 weeks with her and her husband getting sick, and he is getting hospitalized for sepsis. She was able to follow her plan for the last few days and she noted her hunger was controlled.   Subjective:   1. Vitamin D deficiency Christy Randall is taking OTC Vitamin D, but her level is still low and she notes fatigue.   2. Type 2 diabetes mellitus with other specified complication, without long-term current use of insulin Christy Randall's A1c 6.7, and she is on Trulicity. She is working on her diet with a goal below 6.5.  Assessment/Plan:   1. Vitamin D deficiency Christy Randall will continue OTC Vitamin D, and start prescription Vitamin D 50,000 IU once weekly with no refills.   - Vitamin D, Ergocalciferol, (DRISDOL) 1.25 MG (50000 UNIT) CAPS capsule; Take 1 capsule (50,000 Units total) by mouth every 7 (seven) days.  Dispense: 5 capsule; Refill: 0  2. Type 2 diabetes mellitus with other specified complication, without long-term current use of insulin Christy Randall will continue Trulicity and she was educated on insulin resistance and diabetes mellitus, and the disease associated with weight gain. We will continue to follow closely.   3. BMI 40.0-44.9, adult  4. Obesity, Beginning BMI 44.6 Christy Randall is currently in the action stage of change. As such, her goal is to continue with weight loss efforts. She has agreed to the Category 3 Plan.   Behavioral  modification strategies: increasing lean protein intake.  Christy Randall has agreed to follow-up with our clinic in 2 weeks. She was informed of the importance of frequent follow-up visits to maximize her success with intensive lifestyle modifications for her multiple health conditions.   Objective:   Blood pressure 116/73, pulse 66, temperature 98 F (36.7 C), height  (1.651 m), weight 262 lb (118.8 kg), SpO2 94 %. Body mass index is 43.6 kg/m.  Lab Results  Component Value Date   CREATININE 1.02 (H) 08/24/2022   BUN 21 08/24/2022   NA 140 08/24/2022   K 4.1 08/24/2022   CL 97 08/24/2022   CO2 28 08/24/2022   Lab Results  Component Value Date   ALT 13 08/24/2022   AST 17 08/24/2022   ALKPHOS 106 08/24/2022   BILITOT 0.4 08/24/2022   Lab Results  Component Value Date   HGBA1C 6.7 (H) 08/24/2022   HGBA1C 6.6 (H) 11/04/2016   Lab Results  Component Value Date   INSULIN 38.3 (H) 08/24/2022   Lab Results  Component Value Date   TSH 1.360 08/24/2022   Lab Results  Component Value Date   CHOL 150 08/24/2022   HDL 36 (L) 08/24/2022   LDLCALC 92 08/24/2022   TRIG 121 08/24/2022   Lab Results  Component Value Date   VD25OH 27.1 (L) 08/24/2022   Lab Results  Component Value Date   WBC 8.2 08/24/2022   HGB 12.8 08/24/2022   HCT 39.0 08/24/2022  MCV 94 08/24/2022   PLT 255 08/24/2022   No results found for: "IRON", "TIBC", "FERRITIN"  Attestation Statements:   Reviewed by clinician on day of visit: allergies, medications, problem list, medical history, surgical history, family history, social history, and previous encounter notes.  Time spent on visit including pre-visit chart review and post-visit care and charting was 45 minutes.   I, Burt Knack, am acting as transcriptionist for Quillian Quince, MD.  I have reviewed the above documentation for accuracy and completeness, and I agree with the above. -  Quillian Quince, MD

## 2022-09-22 ENCOUNTER — Ambulatory Visit (INDEPENDENT_AMBULATORY_CARE_PROVIDER_SITE_OTHER): Payer: Self-pay | Admitting: Family Medicine

## 2022-09-23 ENCOUNTER — Encounter (INDEPENDENT_AMBULATORY_CARE_PROVIDER_SITE_OTHER): Payer: Self-pay | Admitting: Physician Assistant

## 2022-09-23 ENCOUNTER — Ambulatory Visit (INDEPENDENT_AMBULATORY_CARE_PROVIDER_SITE_OTHER): Payer: 59 | Admitting: Physician Assistant

## 2022-09-23 VITALS — BP 125/79 | HR 62 | Temp 98.1°F | Ht 65.0 in | Wt 259.0 lb

## 2022-09-23 DIAGNOSIS — Z6841 Body Mass Index (BMI) 40.0 and over, adult: Secondary | ICD-10-CM | POA: Diagnosis not present

## 2022-09-23 DIAGNOSIS — E1169 Type 2 diabetes mellitus with other specified complication: Secondary | ICD-10-CM | POA: Diagnosis not present

## 2022-09-23 DIAGNOSIS — E669 Obesity, unspecified: Secondary | ICD-10-CM | POA: Diagnosis not present

## 2022-09-23 DIAGNOSIS — Z7985 Long-term (current) use of injectable non-insulin antidiabetic drugs: Secondary | ICD-10-CM | POA: Diagnosis not present

## 2022-09-23 DIAGNOSIS — E559 Vitamin D deficiency, unspecified: Secondary | ICD-10-CM

## 2022-09-23 MED ORDER — VITAMIN D (ERGOCALCIFEROL) 1.25 MG (50000 UNIT) PO CAPS: 50000.0000 [IU] | ORAL_CAPSULE | ORAL | 0 refills | Status: DC

## 2022-09-23 NOTE — Assessment & Plan Note (Signed)
Type 2 Diabetes Mellitus with other specified complication, without long-term current use of insulin HgbA1c is not at goal. Last A1c was 6.7  Medication(s): Trulicity 0.75 mg SQ weekly Denies mass in neck, dysphagia, dyspepsia, persistent hoarseness, abdominal pain, or N/V/Constipation or diarrhea. Has annual eye exam. Mood is stable.  She is working on nutrition plan to decrease simple carbohydrates, increase lean proteins and exercise to promote weight loss, and improve glycemic control .    Lab Results  Component Value Date   HGBA1C 6.7 (H) 08/24/2022   HGBA1C 6.6 (H) 11/04/2016   Lab Results  Component Value Date   LDLCALC 92 08/24/2022   CREATININE 1.02 (H) 08/24/2022   No results found for: "GFR"  Plan: Continue Trulicity 0.75 mg SQ weekly Patient reports interested in trying other medications for management of T2DM.  Continue working on nutrition plan to decrease simple carbohydrates, increase lean proteins and exercise to promote weight loss, and improve glycemic control.

## 2022-09-23 NOTE — Progress Notes (Signed)
Office: [phone]  /  Fax: [phone]  WEIGHT SUMMARY AND BIOMETRICS  Vitals Temp: 98.1 F (36.7 C) BP: 125/79 Pulse Rate: 62 SpO2: 98 %   Anthropometric Measurements Height: 5\' 5"  (1.651 m) Weight: 259 lb (117.5 kg) BMI (Calculated): 43.1 Weight at Last Visit: 262 lb Weight Lost Since Last Visit: 3 lb Weight Gained Since Last Visit: 0 Starting Weight: 267 lb Total Weight Loss (lbs): 8 lb (3.629 kg)   Body Composition  Body Fat %: 49.3 % Fat Mass (lbs): 128 lbs Muscle Mass (lbs): 125 lbs Total Body Water (lbs): 90.2 lbs Visceral Fat Rating : 17   Other Clinical Data Fasting: yes Labs: no Today's Visit #: #3 Starting Date: 08/24/22     HPI  Chief Complaint: OBESITY  Christy Randall is here to discuss her progress with her obesity treatment plan. Christy Randall is on the the Category 3 Plan and states Christy Randall is following her eating plan approximately 80 % of the time. Christy Randall states Christy Randall is exercising 0 minutes 0 times per week.   Interval History:  Since last office visit Christy Randall is down 3 lbs.  Christy Randall is here with her husband for 3rd visit.   Hunger/appetite- reports poor appetite, feeling too full at times. Using protein supplement at times ( Christy Randall- 18 grams of sugar) and we discussed better protein shake options.  Hydration- not drinking much water and has water bottles to improve intake and discussed using the timed bottle to help with intake.  Sleep- Sleep apnea using CPAP, but not recent follow up and does not feel sleep is restorative. Discussed follow up with provider to optimize OSA treatment and sleep.  Stress: moderate. Christy Randall, but not currently working. 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  Exercise- limited by back pain  Pharmacotherapy: Trulicity for Type 2 diabetes x ~ 3 months per PCP. Denies mass in neck, dysphagia, dyspepsia, persistent hoarseness, abdominal pain, or N/V/Constipation or diarrhea. Has annual eye exam. Mood is stable.  Christy Randall is interested in other  medications for management of Type 2 diabetes and to promote weight loss.   PHYSICAL EXAM:  Blood pressure 125/79, pulse 62, temperature 98.1 F (36.7 C), height 5\' 5"  (1.651 m), weight 259 lb (117.5 kg), SpO2 98 %. Body mass index is 43.1 kg/m.  General: Christy Randall is overweight, cooperative, alert, well developed, and in no acute distress. PSYCH: Has normal mood, affect and thought process.   Cardiovascular: HR 60's regular Lungs: Normal breathing effort, no conversational dyspnea. Neuro: no focal deficits  DIAGNOSTIC DATA REVIEWED:  BMET    Component Value Date/Time   NA 140 08/24/2022 0954   K 4.1 08/24/2022 0954   CL 97 08/24/2022 0954   CO2 28 08/24/2022 0954   GLUCOSE 110 (H) 08/24/2022 0954   GLUCOSE 105 (H) 10/29/2021 0818   BUN 21 08/24/2022 0954   CREATININE 1.02 (H) 08/24/2022 0954   CALCIUM 9.6 08/24/2022 0954   GFRNONAA >60 10/29/2021 0818   GFRAA >60 11/04/2016 0331   Lab Results  Component Value Date   HGBA1C 6.7 (H) 08/24/2022   HGBA1C 6.6 (H) 11/04/2016   Lab Results  Component Value Date   INSULIN 38.3 (H) 08/24/2022   Lab Results  Component Value Date   TSH 1.360 08/24/2022   CBC    Component Value Date/Time   WBC 8.2 08/24/2022 0954   WBC 18.2 (H) 11/04/2016 0331   RBC 4.13 08/24/2022 0954   RBC 3.67 (L) 11/04/2016 0331   HGB  12.8 08/24/2022 0954   HCT 39.0 08/24/2022 0954   PLT 255 08/24/2022 0954   MCV 94 08/24/2022 0954   MCH 31.0 08/24/2022 0954   MCH 30.8 11/04/2016 0331   MCHC 32.8 08/24/2022 0954   MCHC 32.2 11/04/2016 0331   RDW 12.9 08/24/2022 0954   Iron Studies No results found for: "IRON", "TIBC", "FERRITIN", "IRONPCTSAT" Lipid Panel     Component Value Date/Time   CHOL 150 08/24/2022 0954   TRIG 121 08/24/2022 0954   HDL 36 (L) 08/24/2022 0954   LDLCALC 92 08/24/2022 0954   Hepatic Function Panel     Component Value Date/Time   PROT 6.6 08/24/2022 0954   ALBUMIN 4.2 08/24/2022 0954   AST 17 08/24/2022 0954   ALT  13 08/24/2022 0954   ALKPHOS 106 08/24/2022 0954   BILITOT 0.4 08/24/2022 0954      Component Value Date/Time   TSH 1.360 08/24/2022 0954   Nutritional Lab Results  Component Value Date   VD25OH 27.1 (L) 08/24/2022    ASSOCIATED CONDITIONS ADDRESSED TODAY  ASSESSMENT AND PLAN  Problem List Items Addressed This Visit     Diabetes mellitus (HCC) - Primary    Type 2 Diabetes Mellitus with other specified complication, without long-term current use of insulin HgbA1c is not at goal. Last A1c was 6.7  Medication(s): Trulicity 0.75 mg SQ weekly Denies mass in neck, dysphagia, dyspepsia, persistent hoarseness, abdominal pain, or N/V/Constipation or diarrhea. Has annual eye exam. Mood is stable.  Christy Randall is working on nutrition plan to decrease simple carbohydrates, increase lean proteins and exercise to promote weight loss, and improve glycemic control .    Lab Results  Component Value Date   HGBA1C 6.7 (H) 08/24/2022   HGBA1C 6.6 (H) 11/04/2016   Lab Results  Component Value Date   LDLCALC 92 08/24/2022   CREATININE 1.02 (H) 08/24/2022  No results found for: "GFR"  Plan: Continue Trulicity 0.75 mg SQ weekly Patient reports interested in trying other medications for management of T2DM.  Continue working on nutrition plan to decrease simple carbohydrates, increase lean proteins and exercise to promote weight loss, and improve glycemic control.        Vitamin D deficiency    Vitamin D Deficiency Vitamin D is not at goal of 50.  Most recent vitamin D level was 27.1 Endorses fatigue. Christy Randall is on  prescription ergocalciferol 50,000 IU weekly. No N/V or muscle weakness or other side effects with Ergocalciferol.  Lab Results  Component Value Date   VD25OH 27.1 (L) 08/24/2022   Plan: Continue and refill  prescription ergocalciferol 50,000 IU weekly Low vitamin D levels can be associated with adiposity and may result in leptin resistance and weight gain. Also associated with  fatigue. Currently on vitamin D supplementation without any adverse effects.         Relevant Medications   Vitamin D, Ergocalciferol, (DRISDOL) 1.25 MG (50000 UNIT) CAPS capsule   BMI 40.0-44.9, adult (HCC)   Obesity, Beginning BMI 44.6   Current BMI 43.2 TBW loss ~ 3 % since 08/24/2022 Goals of improving hydration and protein intake over the next 2 weeks.  TREATMENT PLAN FOR OBESITY:  Recommended Dietary Goals  Mariona is currently in the action stage of change. As such, her goal is to continue weight management plan. Christy Randall has agreed to the Category 3 Plan.with breakfast and lunch options.  Behavioral Intervention  We discussed the following Behavioral Modification Strategies today: increasing lean protein intake, decreasing simple carbohydrates , increasing  vegetables, increasing lower glycemic fruits, avoiding skipping meals, increasing water intake, continue to practice mindfulness when eating, and planning for success.  Additional resources provided today: Breakfast and Lunch options.   Recommended Physical Activity Goals  Aybree has been advised to work up to 150 minutes of moderate intensity aerobic activity a week and strengthening exercises 2-3 times per week for cardiovascular health, weight loss maintenance and preservation of muscle mass.   Christy Randall has agreed to Continue current level of physical activity    Pharmacotherapy We discussed various medication options to help Oriana with her weight loss efforts and we both agreed to continue Trulicity for Type 2 diabetes and continue to work on nutritional and behavioral strategies to promote weight loss.      Return in about 2 weeks (around 10/07/2022).Marland Kitchen Christy Randall was informed of the importance of frequent follow up visits to maximize her success with intensive lifestyle modifications for her multiple health conditions.   ATTESTASTION STATEMENTS:  Reviewed by clinician on day of visit: allergies, medications, problem list, medical  history, surgical history, family history, social history, and previous encounter notes.   I have personally spent 37 minutes total time today in preparation, patient care, nutritional counseling and documentation for this visit, including the following: review of clinical lab tests; review of medical tests/procedures/services.      Frederica Chrestman, PA-C

## 2022-09-23 NOTE — Assessment & Plan Note (Signed)
Vitamin D Deficiency Vitamin D is not at goal of 50.  Most recent vitamin D level was 27.1 Endorses fatigue. She is on  prescription ergocalciferol 50,000 IU weekly. No N/V or muscle weakness or other side effects with Ergocalciferol.  Lab Results  Component Value Date   VD25OH 27.1 (L) 08/24/2022    Plan: Continue and refill  prescription ergocalciferol 50,000 IU weekly Low vitamin D levels can be associated with adiposity and may result in leptin resistance and weight gain. Also associated with fatigue. Currently on vitamin D supplementation without any adverse effects.

## 2022-10-13 ENCOUNTER — Ambulatory Visit (INDEPENDENT_AMBULATORY_CARE_PROVIDER_SITE_OTHER): Payer: 59 | Admitting: Family Medicine

## 2022-10-13 ENCOUNTER — Encounter (INDEPENDENT_AMBULATORY_CARE_PROVIDER_SITE_OTHER): Payer: Self-pay | Admitting: Family Medicine

## 2022-10-13 VITALS — BP 114/69 | HR 69 | Temp 98.0°F | Ht 65.0 in | Wt 256.0 lb

## 2022-10-13 DIAGNOSIS — G4709 Other insomnia: Secondary | ICD-10-CM | POA: Diagnosis not present

## 2022-10-13 DIAGNOSIS — Z6841 Body Mass Index (BMI) 40.0 and over, adult: Secondary | ICD-10-CM

## 2022-10-13 DIAGNOSIS — E669 Obesity, unspecified: Secondary | ICD-10-CM

## 2022-10-13 DIAGNOSIS — M25552 Pain in left hip: Secondary | ICD-10-CM

## 2022-10-13 DIAGNOSIS — B372 Candidiasis of skin and nail: Secondary | ICD-10-CM

## 2022-10-13 DIAGNOSIS — M25551 Pain in right hip: Secondary | ICD-10-CM | POA: Diagnosis not present

## 2022-10-13 DIAGNOSIS — E559 Vitamin D deficiency, unspecified: Secondary | ICD-10-CM

## 2022-10-13 MED ORDER — NYSTATIN 100000 UNIT/GM EX POWD
1.0000 | Freq: Four times a day (QID) | CUTANEOUS | 0 refills | Status: DC
Start: 2022-10-13 — End: 2022-11-03

## 2022-10-13 MED ORDER — VITAMIN D (ERGOCALCIFEROL) 1.25 MG (50000 UNIT) PO CAPS: 50000.0000 [IU] | ORAL_CAPSULE | ORAL | 0 refills | Status: DC

## 2022-10-14 NOTE — Progress Notes (Signed)
Chief Complaint:   OBESITY Christy Randall is here to discuss her progress with her obesity treatment plan along with follow-up of her obesity related diagnoses. Christy Randall is on the Category 3 Plan with breakfast and lunch options and states she is following her eating plan approximately 50% of the time. Christy Randall states she is doing 0 minutes 0 times per week.  Today's visit was #: 4 Starting weight: 267 lbs Starting date: 08/24/2022 Today's weight: 256 lbs Today's date: 10/13/2022 Total lbs lost to date: 11 Total lbs lost since last in-office visit: 3  Interim History: Chane continues to work on her weight loss. She is deviating more from her plan, but she is trying to increase her protein and make smarter choices.   Subjective:   1. Other insomnia Christy Randall stopped taking melatonin and she feels she is sleeping better and feels more rested in the morning. She is sleeping 7-8 hours.    2. Cutaneous candidiasis Christy Randall notes occasional infections under her pannus and she uses OTC lotrimin. She asks if she can use something to prevent the infection.   3. Bilateral hip pain Christy Randall has done physical therapy for her back, but she now has pain developing in her buttocks with L>R. She notes it is worse when walking on cement.   4. Vitamin D deficiency Christy Randall is on Vitamin D, and her level is not yet at goal. No side effects were noted.   Assessment/Plan:   1. Other insomnia Christy Randall agreed to stop melatonin, and continue with her weight loss efforts.   2. Cutaneous candidiasis Christy Randall agreed to start nystatin powder every 6 hour as needed.   - nystatin (MYCOSTATIN/NYSTOP) powder; Apply 1 Application topically every 6 (six) hours.  Dispense: 120 g; Refill: 0  3. Bilateral hip pain Christy Randall was referred to Dr. Denyse Amass, Sports Medicine for evaluation.   - Ambulatory referral to Sports Medicine  4. Vitamin D deficiency Christy Randall will continue prescription Vitamin D, and we will refill for 1 month.   - Vitamin D,  Ergocalciferol, (DRISDOL) 1.25 MG (50000 UNIT) CAPS capsule; Take 1 capsule (50,000 Units total) by mouth every 7 (seven) days.  Dispense: 5 capsule; Refill: 0  5. BMI 40.0-44.9, adult (HCC) Current BMI 43.2  6. Obesity, Beginning BMI 44.6 Christy Randall is currently in the action stage of change. As such, her goal is to continue with weight loss efforts. She has agreed to the Category 3 Plan.   Exercise goals: Start considering what exercise she would like to do.   Behavioral modification strategies: no skipping meals.  Christy Randall has agreed to follow-up with our clinic in 2 to 3 weeks. She was informed of the importance of frequent follow-up visits to maximize her success with intensive lifestyle modifications for her multiple health conditions.   Objective:   Blood pressure 114/69, pulse 69, temperature 98 F (36.7 C), height 5\' 5"  (1.651 m), weight 256 lb (116.1 kg), SpO2 91 %. Body mass index is 42.6 kg/m.  Lab Results  Component Value Date   CREATININE 1.02 (H) 08/24/2022   BUN 21 08/24/2022   NA 140 08/24/2022   K 4.1 08/24/2022   CL 97 08/24/2022   CO2 28 08/24/2022   Lab Results  Component Value Date   ALT 13 08/24/2022   AST 17 08/24/2022   ALKPHOS 106 08/24/2022   BILITOT 0.4 08/24/2022   Lab Results  Component Value Date   HGBA1C 6.7 (H) 08/24/2022   HGBA1C 6.6 (H) 11/04/2016   Lab Results  Component Value Date   INSULIN 38.3 (H) 08/24/2022   Lab Results  Component Value Date   TSH 1.360 08/24/2022   Lab Results  Component Value Date   CHOL 150 08/24/2022   HDL 36 (L) 08/24/2022   LDLCALC 92 08/24/2022   TRIG 121 08/24/2022   Lab Results  Component Value Date   VD25OH 27.1 (L) 08/24/2022   Lab Results  Component Value Date   WBC 8.2 08/24/2022   HGB 12.8 08/24/2022   HCT 39.0 08/24/2022   MCV 94 08/24/2022   PLT 255 08/24/2022   No results found for: "IRON", "TIBC", "FERRITIN"  Attestation Statements:   Reviewed by clinician on day of visit:  allergies, medications, problem list, medical history, surgical history, family history, social history, and previous encounter notes.   I, Burt Knack, am acting as transcriptionist for Quillian Quince, MD.  I have reviewed the above documentation for accuracy and completeness, and I agree with the above. -  Quillian Quince, MD

## 2022-10-20 DIAGNOSIS — I509 Heart failure, unspecified: Secondary | ICD-10-CM | POA: Diagnosis not present

## 2022-10-20 DIAGNOSIS — I10 Essential (primary) hypertension: Secondary | ICD-10-CM | POA: Diagnosis not present

## 2022-10-20 DIAGNOSIS — E1165 Type 2 diabetes mellitus with hyperglycemia: Secondary | ICD-10-CM | POA: Diagnosis not present

## 2022-10-20 DIAGNOSIS — M25552 Pain in left hip: Secondary | ICD-10-CM | POA: Diagnosis not present

## 2022-10-20 DIAGNOSIS — M25551 Pain in right hip: Secondary | ICD-10-CM | POA: Diagnosis not present

## 2022-10-20 DIAGNOSIS — Z299 Encounter for prophylactic measures, unspecified: Secondary | ICD-10-CM | POA: Diagnosis not present

## 2022-10-21 ENCOUNTER — Ambulatory Visit (INDEPENDENT_AMBULATORY_CARE_PROVIDER_SITE_OTHER): Payer: 59

## 2022-10-21 ENCOUNTER — Ambulatory Visit: Payer: 59 | Admitting: Family Medicine

## 2022-10-21 ENCOUNTER — Encounter: Payer: Self-pay | Admitting: Family Medicine

## 2022-10-21 VITALS — BP 112/76 | HR 55 | Ht 65.0 in | Wt 256.0 lb

## 2022-10-21 DIAGNOSIS — M79605 Pain in left leg: Secondary | ICD-10-CM

## 2022-10-21 DIAGNOSIS — M545 Low back pain, unspecified: Secondary | ICD-10-CM

## 2022-10-21 DIAGNOSIS — M25552 Pain in left hip: Secondary | ICD-10-CM | POA: Diagnosis not present

## 2022-10-21 DIAGNOSIS — M7061 Trochanteric bursitis, right hip: Secondary | ICD-10-CM

## 2022-10-21 DIAGNOSIS — M47816 Spondylosis without myelopathy or radiculopathy, lumbar region: Secondary | ICD-10-CM | POA: Diagnosis not present

## 2022-10-21 DIAGNOSIS — M25551 Pain in right hip: Secondary | ICD-10-CM | POA: Diagnosis not present

## 2022-10-21 DIAGNOSIS — M160 Bilateral primary osteoarthritis of hip: Secondary | ICD-10-CM | POA: Diagnosis not present

## 2022-10-21 NOTE — Patient Instructions (Addendum)
Thank you for coming in today.   Please get an Xray today before you leave   You received an injection today. Seek immediate medical attention if the joint becomes red, extremely painful, or is oozing fluid.   Try stopping the rosuvastatin for a few weeks then restart and see how you feel off and on the medicine.   Anticipate MRI after the xrays.  We will contact you.

## 2022-10-21 NOTE — Progress Notes (Signed)
I, Stevenson Clinch, CMA acting as a scribe for [name], MD.  Christy Randall is a 57 y.o. female who presents to [facility] at [facility] today for bilateral hip pan x years. Pt locates pain to deep in the joint, burning sensation. Pain radiates into the gluteal region, L>R. Completed PT for lower back, therapist d/c therapy because she was not improving. No relief with previous back injections at [facility]. Unable to walk any amount of distance on hard surface without pain. Feels that her quality of life is poor d/t hip and back pain. Just got new script for Nabumetone, has not started yet. 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   Her pain is largely in the superior buttocks area bilaterally.  She does have some back pain and some right lateral hip pain.  Her pain is significant and interferes with walking.  She has tried conventional physical therapy most recently at [facility] in [facility].  This did not work very well.  She is trying to arrange for aquatic physical therapy but the nearest location is in [place] and her insurance does not want to do physical therapy outside of [place].  She would have to go to the [place] area to do aquatic therapy.  Low back pain: chronic Radiates: gluteal region Aggravates: walking esp on hard surfaces Treatments tried: rest, PT  Dx imaging: 01/20/22 L-spine MRI  07/15/21 L-spine MRI  Pertinent review of systems: No fevers or chills  Relevant historical information: COPD.  Diabetes.  Obesity.   On low-dose rosuvastatin 5 mg.  Exam:  BP 112/76   Pulse (!) 55   Ht 5\' 5"  (1.651 m)   Wt 256 lb (116.1 kg)   SpO2 97%   BMI 42.60 kg/m  General: Well Developed, well nourished, and in no acute distress.   MSK: L-spine nontender midline.  Decreased lumbar motion.  Lower extremity strength is intact except noted below.  Right hip normal-appearing Normal motion.  Tender palpation  superior posterior hip buttocks area near iliac crest. Tender also to palpation at greater trochanter. Hip abduction strength is diminished 4/5.  External rotation strength is mildly diminished 4/5.  Left hip: Normal-appearing Again tender palpation at superior portion of the posterior buttock and hip area near iliac crest.  Nontender greater trochanter.  Hip abduction and external rotation diminished 4/5.    Lab and Radiology Results  Hip greater trochanteric injection: Right Consent obtained and timeout performed. Area of maximum tenderness palpated and identified. Skin cleaned with alcohol, cold spray applied. A spinal needle was used to access the greater trochanteric bursa. 40mg  of Kenalog and 2 mL of Marcaine were used to inject the trochanteric bursa. Patient tolerated the procedure well.     X-ray images lumbar spine and bilateral hips obtained today personally and independently interpreted.  Lumbar spine: DDD L5-S1.  No acute fractures are visible.  Bilateral hips: Mild bilateral hip arthritis.  No acute fractures.  Some degenerative changes are present bilateral SI joints.  Await formal radiology review  EXAM: MRI LUMBAR SPINE WITHOUT CONTRAST   TECHNIQUE: Multiplanar, multisequence MR imaging of the lumbar spine was performed. No intravenous contrast was administered.   COMPARISON:  07/15/2021   FINDINGS: Segmentation:  5 lumbar type vertebral bodies.   Alignment:  Normal   Vertebrae: No fracture or focal bone lesion. Mild discogenic endplate edema at the L5-S1 level could relate to low back pain. This is progressive since February of this  year.   Conus medullaris and cauda equina: Conus extends to the L1 level. Conus and cauda equina appear normal.   Paraspinal and other soft tissues: Negative   Disc levels:   No disc level pathology from T11-12 through L2-3.   L3-4: Shallow protrusion of the disc with a small herniation showing upward migration  to the left of midline. Similar appearance to the study of February. Mild stenosis at this level that could possibly be symptomatic.   L4-5: Bulging of the disc more prominent towards the left. Mild facet and ligamentous hypertrophy. Mild narrowing of the lateral recesses left more than right but without visible neural compression. The facet arthritis could be a cause of back pain or referred facet syndrome pain.   L5-S1: Desiccation of the disc without bulge or herniation. Posterior annular fissure. Discogenic edema of the posterior endplates as described above, which could be associated with back pain. No compressive canal or foraminal narrowing. Bilateral facet osteoarthritis which could be painful.   IMPRESSION: L3-4: Similar appearance to the study of February. Shallow protrusion of the disc with a small herniation showing upward migration to the left of midline. Mild spinal stenosis at this level that could possibly be symptomatic.   L4-5: Disc bulge more prominent towards the left. Bilateral facet degeneration and hypertrophy. Mild narrowing of the lateral recesses but without visible neural compression. The facet arthritis could be symptomatic.   L5-S1: Disc degeneration with posterior annular tearing but no bulge or herniation. Discogenic endplate edema posteriorly, new since the study of 2023, which could be correlated with regional back pain. Facet osteoarthritis at this level could also be symptomatic.     Electronically Signed   By: Paulina Fusi M.D.   On: 01/21/2022 13:48 I, [name], personally (independently) visualized and performed the interpretation of the images attached in this note.    Assessment and Plan: 57 y.o. female with bilateral posterior buttocks pain.  Etiology is unclear.  This could be referred pain from the lumbar spine or facet joints.  It could be an isolated issue with the gluteus muscles.  There is probably some overlap.  It does seem  like she has right greater trochanter bursitis.   Plan today for right greater trochanter bursa injection.  Additionally advised her to pause her rosuvastatin for a few weeks see how she feels off of that and restart it and see how she feels on it.  There may be some myalgia from the rosuvastatin as a cause.  Next step should be MRI pelvis to evaluate muscle and tendinitis near the area of pain.  PDMP not reviewed this encounter. Orders Placed This Encounter  Procedures   DG Lumbar Spine 2-3 Views    Standing Status:   Future    Number of Occurrences:   1    Standing Expiration Date:   11/21/2022    Order Specific Question:   Reason for Exam (SYMPTOM  OR DIAGNOSIS REQUIRED)    Answer:   low back pain    Order Specific Question:   Preferred imaging location?    Answer:   [facility]    Order Specific Question:   Is patient pregnant?    Answer:   No   DG HIPS BILAT W OR W/O PELVIS MIN 5 VIEWS    Standing Status:   Future    Number of Occurrences:   1    Standing Expiration Date:   10/21/2023    Order Specific Question:   Reason  for Exam (SYMPTOM  OR DIAGNOSIS REQUIRED)    Answer:   bilateral hip pain    Order Specific Question:   Is patient pregnant?    Answer:   No    Order Specific Question:   Preferred imaging location?    Answer:   [facility]   MR PELVIS WO CONTRAST    Pain is located at the superior posterior buttocks near the iliac crest bilaterally thought to be myalgias or myositis or possibly gluteus tendinitis at origin.  Please include any additional views to see this area more accurately.    Standing Status:   Future    Standing Expiration Date:   10/21/2023    Order Specific Question:   What is the patient's sedation requirement?    Answer:   No Sedation    Order Specific Question:   Does the patient have a pacemaker or implanted devices?    Answer:   No    Order Specific Question:   Preferred imaging location?    Answer:   [facility] (table  limit-350lbs)   No orders of the defined types were placed in this encounter.    Discussed warning signs or symptoms. Please see discharge instructions. Patient expresses understanding.   The above documentation has been reviewed and is accurate and complete [name], M.D.

## 2022-10-26 NOTE — Progress Notes (Signed)
Lumbar spine x-ray shows some arthritis changes aortic atherosclerosis calcifications of the aorta.

## 2022-10-26 NOTE — Progress Notes (Signed)
Bilateral hip x-ray shows some mild arthritis changes both sides.

## 2022-10-31 DIAGNOSIS — D123 Benign neoplasm of transverse colon: Secondary | ICD-10-CM | POA: Diagnosis not present

## 2022-10-31 DIAGNOSIS — K529 Noninfective gastroenteritis and colitis, unspecified: Secondary | ICD-10-CM | POA: Diagnosis not present

## 2022-10-31 DIAGNOSIS — D124 Benign neoplasm of descending colon: Secondary | ICD-10-CM | POA: Diagnosis not present

## 2022-10-31 DIAGNOSIS — K2970 Gastritis, unspecified, without bleeding: Secondary | ICD-10-CM | POA: Diagnosis not present

## 2022-11-01 ENCOUNTER — Ambulatory Visit (INDEPENDENT_AMBULATORY_CARE_PROVIDER_SITE_OTHER): Payer: 59

## 2022-11-01 DIAGNOSIS — M25552 Pain in left hip: Secondary | ICD-10-CM | POA: Diagnosis not present

## 2022-11-01 DIAGNOSIS — M4696 Unspecified inflammatory spondylopathy, lumbar region: Secondary | ICD-10-CM | POA: Diagnosis not present

## 2022-11-01 DIAGNOSIS — M79605 Pain in left leg: Secondary | ICD-10-CM

## 2022-11-01 DIAGNOSIS — M25551 Pain in right hip: Secondary | ICD-10-CM

## 2022-11-01 DIAGNOSIS — M545 Low back pain, unspecified: Secondary | ICD-10-CM

## 2022-11-03 ENCOUNTER — Encounter (INDEPENDENT_AMBULATORY_CARE_PROVIDER_SITE_OTHER): Payer: Self-pay | Admitting: Family Medicine

## 2022-11-03 ENCOUNTER — Ambulatory Visit (INDEPENDENT_AMBULATORY_CARE_PROVIDER_SITE_OTHER): Payer: 59 | Admitting: Family Medicine

## 2022-11-03 VITALS — BP 109/69 | HR 50 | Temp 98.0°F | Ht 65.0 in | Wt 254.0 lb

## 2022-11-03 DIAGNOSIS — E782 Mixed hyperlipidemia: Secondary | ICD-10-CM

## 2022-11-03 DIAGNOSIS — E559 Vitamin D deficiency, unspecified: Secondary | ICD-10-CM | POA: Diagnosis not present

## 2022-11-03 DIAGNOSIS — E669 Obesity, unspecified: Secondary | ICD-10-CM | POA: Diagnosis not present

## 2022-11-03 DIAGNOSIS — B372 Candidiasis of skin and nail: Secondary | ICD-10-CM | POA: Diagnosis not present

## 2022-11-03 DIAGNOSIS — Z6841 Body Mass Index (BMI) 40.0 and over, adult: Secondary | ICD-10-CM | POA: Diagnosis not present

## 2022-11-03 MED ORDER — VITAMIN D (ERGOCALCIFEROL) 1.25 MG (50000 UNIT) PO CAPS: 50000.0000 [IU] | ORAL_CAPSULE | ORAL | 0 refills | Status: DC

## 2022-11-03 MED ORDER — NYSTATIN 100000 UNIT/GM EX POWD
1.0000 | Freq: Four times a day (QID) | CUTANEOUS | 0 refills | Status: AC
Start: 2022-11-03 — End: 2022-12-03

## 2022-11-03 NOTE — Progress Notes (Unsigned)
     Chief Complaint:   OBESITY Christy Randall is here to discuss her progress with her obesity treatment plan along with follow-up of her obesity related diagnoses. Christy Randall is on {MWMwtlossportion/plan2:23431} and states she is following her eating plan approximately ***% of the time. Christy Randall states she is *** *** minutes *** times per week.  Today's visit was #: *** Starting weight: *** Starting date: *** Today's weight: *** Today's date: 11/03/2022 Total lbs lost to date: *** Total lbs lost since last in-office visit: ***  Interim History: ***  Subjective:   1. Vitamin D deficiency ***  2. Mixed hyperlipidemia ***  3. Cutaneous candidiasis ***  Assessment/Plan:   1. Vitamin D deficiency *** - Vitamin D, Ergocalciferol, (DRISDOL) 1.25 MG (50000 UNIT) CAPS capsule; Take 1 capsule (50,000 Units total) by mouth every 7 (seven) days.  Dispense: 5 capsule; Refill: 0  2. Mixed hyperlipidemia ***  3. Cutaneous candidiasis *** - nystatin (MYCOSTATIN/NYSTOP) powder; Apply 1 Application topically every 6 (six) hours.  Dispense: 120 g; Refill: 0  4. BMI 40.0-44.9, adult (HCC) Current BMI 43.2  5. Obesity, Beginning BMI 44.6 Christy Randall is currently in the action stage of change. As such, her goal is to continue with weight loss efforts. She has agreed to the Category 3 Plan.   Behavioral modification strategies: increasing lean protein intake and meal planning and cooking strategies.  Christy Randall has agreed to follow-up with our clinic in 4 weeks. She was informed of the importance of frequent follow-up visits to maximize her success with intensive lifestyle modifications for her multiple health conditions.   Objective:   Blood pressure 109/69, pulse (!) 50, temperature 98 F (36.7 C), height 5\' 5"  (1.651 m), weight 254 lb (115.2 kg), SpO2 99 %. Body mass index is 42.27 kg/m.  Lab Results  Component Value Date   CREATININE 1.02 (H) 08/24/2022   BUN 21 08/24/2022   NA 140 08/24/2022   K  4.1 08/24/2022   CL 97 08/24/2022   CO2 28 08/24/2022   Lab Results  Component Value Date   ALT 13 08/24/2022   AST 17 08/24/2022   ALKPHOS 106 08/24/2022   BILITOT 0.4 08/24/2022   Lab Results  Component Value Date   HGBA1C 6.7 (H) 08/24/2022   HGBA1C 6.6 (H) 11/04/2016   Lab Results  Component Value Date   INSULIN 38.3 (H) 08/24/2022   Lab Results  Component Value Date   TSH 1.360 08/24/2022   Lab Results  Component Value Date   CHOL 150 08/24/2022   HDL 36 (L) 08/24/2022   LDLCALC 92 08/24/2022   TRIG 121 08/24/2022   Lab Results  Component Value Date   VD25OH 27.1 (L) 08/24/2022   Lab Results  Component Value Date   WBC 8.2 08/24/2022   HGB 12.8 08/24/2022   HCT 39.0 08/24/2022   MCV 94 08/24/2022   PLT 255 08/24/2022   No results found for: "IRON", "TIBC", "FERRITIN"  Attestation Statements:   Reviewed by clinician on day of visit: allergies, medications, problem list, medical history, surgical history, family history, social history, and previous encounter notes.   I, Burt Knack, am acting as transcriptionist for Quillian Quince, MD.  I have reviewed the above documentation for accuracy and completeness, and I agree with the above. -  ***

## 2022-11-06 NOTE — Progress Notes (Unsigned)
Christy Payor, PhD, LAT, ATC acting as a scribe for Christy Graham, MD.  Christy Randall is a 57 y.o. female who presents to [facility] at [facility] today for f/u bilat hip pain w/ pelvis MRI review. Pt was last seen by Dr. Denyse Amass on 10/21/22 and was given a R GT steroid injection and was advised to pause on her rosuvastatin. A pelvis MRI was also ordered.   Today, pt reports she is better on the right side. Left side is still in pain   Dx imaging: 11/01/22 Pelvis MRI  10/21/22 Bilat hips/pelvis XR 01/20/22 L-spine MRI             07/15/21 L-spine MRI  Pertinent review of systems: No fevers or chills  Relevant historical information: Diabetes   Exam:  BP 122/72   Pulse (!) 56   Ht 5\' 5"  (1.651 m)   Wt 254 lb (115.2 kg)   SpO2 98%   BMI 42.27 kg/m  General: Well Developed, well nourished, and in no acute distress.   MSK: L-spine: Normal. Nontender palpation. Decreased lumbar motion. Lower extremity strength is intact.    Lab and Radiology Results  XAM: MRI PELVIS WITHOUT CONTRAST   TECHNIQUE: Multiplanar multisequence MR imaging of the pelvis was performed. No intravenous contrast was administered.   COMPARISON:  Bilateral hip x-rays dated Oct 21, 2022.   FINDINGS: Bones: There is no evidence of acute fracture, dislocation or avascular necrosis. No focal bone lesion. The visualized sacroiliac joints and symphysis pubis appear normal. Advanced lower lumbar facet arthropathy.   Articular cartilage and labrum   Articular cartilage: No focal chondral defect or subchondral signal abnormality identified.   Labrum: Grossly intact, although evaluation is limited due to lack of intra-articular fluid. No paralabral abnormality.   Joint or bursal effusion   Joint effusion: No significant hip joint effusion.   Bursae: No focal periarticular fluid collection.   Muscles and tendons   Muscles and tendons: The visualized gluteus, hamstring and  iliopsoas tendons appear normal. No muscle edema or atrophy.   Other findings   Miscellaneous: The visualized internal pelvic contents appear unremarkable.   IMPRESSION: 1. Advanced lower lumbar facet arthropathy. Otherwise unremarkable MRI of the pelvis.     Electronically Signed   By: Obie Dredge M.D.   On: 11/09/2022 08:50 I, Christy Randall, personally (independently) visualized and performed the interpretation of the images attached in this note.  EXAM: MRI LUMBAR SPINE WITHOUT CONTRAST   TECHNIQUE: Multiplanar, multisequence MR imaging of the lumbar spine was performed. No intravenous contrast was administered.   COMPARISON:  07/15/2021   FINDINGS: Segmentation:  5 lumbar type vertebral bodies.   Alignment:  Normal   Vertebrae: No fracture or focal bone lesion. Mild discogenic endplate edema at the L5-S1 level could relate to low back pain. This is progressive since February of this year.   Conus medullaris and cauda equina: Conus extends to the L1 level. Conus and cauda equina appear normal.   Paraspinal and other soft tissues: Negative   Disc levels:   No disc level pathology from T11-12 through L2-3.   L3-4: Shallow protrusion of the disc with a small herniation showing upward migration to the left of midline. Similar appearance to the study of February. Mild stenosis at this level that could possibly be symptomatic.   L4-5: Bulging of the disc more prominent towards the left. Mild facet and ligamentous hypertrophy. Mild narrowing of the lateral recesses left more than right  but without visible neural compression. The facet arthritis could be a cause of back pain or referred facet syndrome pain.   L5-S1: Desiccation of the disc without bulge or herniation. Posterior annular fissure. Discogenic edema of the posterior endplates as described above, which could be associated with back pain. No compressive canal or foraminal narrowing. Bilateral  facet osteoarthritis which could be painful.   IMPRESSION: L3-4: Similar appearance to the study of February. Shallow protrusion of the disc with a small herniation showing upward migration to the left of midline. Mild spinal stenosis at this level that could possibly be symptomatic.   L4-5: Disc bulge more prominent towards the left. Bilateral facet degeneration and hypertrophy. Mild narrowing of the lateral recesses but without visible neural compression. The facet arthritis could be symptomatic.   L5-S1: Disc degeneration with posterior annular tearing but no bulge or herniation. Discogenic endplate edema posteriorly, new since the study of 2023, which could be correlated with regional back pain. Facet osteoarthritis at this level could also be symptomatic.     Electronically Signed   By: Paulina Fusi M.D.   On: 01/21/2022 13:48      Assessment and Plan: 57 y.o. female with chronic low back pain.  Pain extends from the top of the buttocks to the hips.  She had some benefit with greater trochanter injection and discontinuing the rosuvastatin temporarily.  I think majority the pain is back related and likely facet DJD related.  Plan for facet injection bilateral L5-S1.  We could follow this up with L4-L5 if needed bilaterally.  Recommend restarting rosuvastatin and see if her pain returns.  That would be a good indicator that she should switch medications.  She will keep me updated with how this feels and we could proceed to medial branch block and ablation if needed.   PDMP not reviewed this encounter. Orders Placed This Encounter  Procedures   DG FACET JT INJ L /S SINGLE LEVEL LEFT W/FL/CT    Standing Status:   Future    Standing Expiration Date:   11/09/2023    Order Specific Question:   Reason for Exam (SYMPTOM  OR DIAGNOSIS REQUIRED)    Answer:   BL L5-S1    Order Specific Question:   Is the patient pregnant?    Answer:   No    Order Specific Question:   Preferred  Imaging Location?    Answer:   [facility]    Order Specific Question:   Radiology Contrast Protocol - do NOT remove file path    Answer:   \\[email or web address]   DG FACET JT INJ L /S SINGLE LEVEL RIGHT W/FL/CT    Standing Status:   Future    Standing Expiration Date:   11/09/2023    Order Specific Question:   Reason for Exam (SYMPTOM  OR DIAGNOSIS REQUIRED)    Answer:   Bl L5-S1    Order Specific Question:   Is the patient pregnant?    Answer:   No    Order Specific Question:   Preferred Imaging Location?    Answer:   [facility]    Order Specific Question:   Radiology Contrast Protocol - do NOT remove file path    Answer:   \\[email or web address]   No orders of the defined types were placed in this encounter.    Discussed warning signs or symptoms. Please see discharge instructions. Patient expresses understanding.   The above documentation has been reviewed and is accurate and complete Clayburn Pert  Denyse Amass, M.D. Total encounter time 30 minutes including face-to-face time with the patient and, reviewing past medical record, and charting on the date of service.

## 2022-11-09 ENCOUNTER — Ambulatory Visit (INDEPENDENT_AMBULATORY_CARE_PROVIDER_SITE_OTHER): Payer: 59 | Admitting: Family Medicine

## 2022-11-09 VITALS — BP 122/72 | HR 56 | Ht 65.0 in | Wt 254.0 lb

## 2022-11-09 DIAGNOSIS — M545 Low back pain, unspecified: Secondary | ICD-10-CM | POA: Diagnosis not present

## 2022-11-09 DIAGNOSIS — M47816 Spondylosis without myelopathy or radiculopathy, lumbar region: Secondary | ICD-10-CM | POA: Diagnosis not present

## 2022-11-09 DIAGNOSIS — G8929 Other chronic pain: Secondary | ICD-10-CM

## 2022-11-09 NOTE — Patient Instructions (Addendum)
Thank you for coming in today.   Please call  Imaging at [phone] to schedule your spine injection.    Let me know how you feel after those injection.   I am asking for injections both sides L5-S1. Next step would likely be the same injection at L4-L5

## 2022-11-11 ENCOUNTER — Other Ambulatory Visit: Payer: Self-pay | Admitting: Family Medicine

## 2022-11-11 NOTE — Telephone Encounter (Signed)
Patient is scheduled for her epidural on 6/27. She said that she talked to Dr Denyse Amass about sending in something for her to take before to keep her calm.  Pharm[facility]: [facility]

## 2022-11-11 NOTE — Telephone Encounter (Signed)
Pre-procedure Valium pending, forwarding to Dr. Denyse Amass.

## 2022-11-12 MED ORDER — DIAZEPAM 5 MG PO TABS: 5.0000 mg | ORAL_TABLET | Freq: Once | ORAL | 0 refills | Status: AC

## 2022-11-12 NOTE — Telephone Encounter (Signed)
Valium prescribed

## 2022-11-16 DIAGNOSIS — E119 Type 2 diabetes mellitus without complications: Secondary | ICD-10-CM | POA: Diagnosis not present

## 2022-11-19 ENCOUNTER — Inpatient Hospital Stay: Admission: RE | Admit: 2022-11-19 | Payer: 59 | Source: Ambulatory Visit

## 2022-11-19 ENCOUNTER — Other Ambulatory Visit: Payer: 59

## 2022-11-19 ENCOUNTER — Encounter: Payer: Self-pay | Admitting: Family Medicine

## 2022-11-23 ENCOUNTER — Telehealth: Payer: Self-pay | Admitting: Family Medicine

## 2022-11-23 DIAGNOSIS — M47816 Spondylosis without myelopathy or radiculopathy, lumbar region: Secondary | ICD-10-CM

## 2022-11-23 DIAGNOSIS — G8929 Other chronic pain: Secondary | ICD-10-CM

## 2022-11-23 NOTE — Telephone Encounter (Signed)
Spoke to pt and explained. She stated that her insurance company had already called her informing her of the approval. Explained a bit more about the block and ablation vs facet injection. She verbalized understanding and was sent [facility]'s # via MyChart message reply.

## 2022-11-23 NOTE — Telephone Encounter (Signed)
Completed peer to peer your discussion today to get facet injections authorized.  Her insurance will not pay for steroid facet injections but will pay for medial branch block and ablation.  She will need to have 2 blocks to get the ablation authorized. Plan to change the order from facet injections to medial branch block and ablation.  Authorization  number: [id number]  Will need to contact the patient and let her know we are changing the injection slightly to the medial branch block and ablation.

## 2022-12-01 ENCOUNTER — Other Ambulatory Visit: Payer: Self-pay | Admitting: Family Medicine

## 2022-12-01 ENCOUNTER — Ambulatory Visit
Admission: RE | Admit: 2022-12-01 | Discharge: 2022-12-01 | Disposition: A | Discharge status: Home / Self Care | Payer: 59 | Source: Ambulatory Visit | Attending: Family Medicine | Admitting: Family Medicine

## 2022-12-01 DIAGNOSIS — M47816 Spondylosis without myelopathy or radiculopathy, lumbar region: Secondary | ICD-10-CM

## 2022-12-01 DIAGNOSIS — M545 Low back pain, unspecified: Secondary | ICD-10-CM

## 2022-12-01 DIAGNOSIS — M25559 Pain in unspecified hip: Secondary | ICD-10-CM | POA: Diagnosis not present

## 2022-12-01 NOTE — Discharge Instructions (Signed)
Medial Branch Block Discharge Instructions  Take over-the-counter and prescription medicines only as told by your health care provider.  Do not drive the day of your procedure  Return to your normal activities as told by your health care provider.   If injection site is sore you may ice the area for 20 minutes, 2-3 times a day.   Check your injection site every day for signs of infection. Check for: Redness, swelling, or pain. Fluid or blood. Warmth. Pus or a bad smell.  Please contact our office at [phone] if: You have a fever or chills. You have any signs of infection. You develop any numbness or weakness.   Thank you for visiting our office. Medial Branch Block Discharge Instructions  Take over-the-counter and prescription medicines only as told by your health care provider.  Do not drive the day of your procedure  Return to your normal activities as told by your health care provider.   If injection site is sore you may ice the area for 20 minutes, 2-3 times a day.   Check your injection site every day for signs of infection. Check for: Redness, swelling, or pain. Fluid or blood. Warmth. Pus or a bad smell.  Please contact our office at [phone] if: You have a fever or chills. You have any signs of infection. You develop any numbness or weakness.   Thank you for visiting our office.  

## 2022-12-02 ENCOUNTER — Encounter (INDEPENDENT_AMBULATORY_CARE_PROVIDER_SITE_OTHER): Payer: Self-pay | Admitting: Family Medicine

## 2022-12-02 ENCOUNTER — Other Ambulatory Visit (INDEPENDENT_AMBULATORY_CARE_PROVIDER_SITE_OTHER): Payer: Self-pay | Admitting: Family Medicine

## 2022-12-02 ENCOUNTER — Ambulatory Visit (INDEPENDENT_AMBULATORY_CARE_PROVIDER_SITE_OTHER): Payer: 59 | Admitting: Family Medicine

## 2022-12-02 VITALS — BP 118/70 | HR 61 | Temp 97.9°F | Ht 65.0 in | Wt 254.0 lb

## 2022-12-02 DIAGNOSIS — Z6841 Body Mass Index (BMI) 40.0 and over, adult: Secondary | ICD-10-CM | POA: Diagnosis not present

## 2022-12-02 DIAGNOSIS — E669 Obesity, unspecified: Secondary | ICD-10-CM

## 2022-12-02 DIAGNOSIS — E559 Vitamin D deficiency, unspecified: Secondary | ICD-10-CM | POA: Diagnosis not present

## 2022-12-02 DIAGNOSIS — E1169 Type 2 diabetes mellitus with other specified complication: Secondary | ICD-10-CM

## 2022-12-02 DIAGNOSIS — Z7985 Long-term (current) use of injectable non-insulin antidiabetic drugs: Secondary | ICD-10-CM

## 2022-12-02 DIAGNOSIS — B372 Candidiasis of skin and nail: Secondary | ICD-10-CM

## 2022-12-02 MED ORDER — TRULICITY 1.5 MG/0.5ML ~~LOC~~ SOAJ
1.5000 mg | SUBCUTANEOUS | 0 refills | Status: DC
Start: 2022-12-02 — End: 2024-03-14

## 2022-12-02 MED ORDER — VITAMIN D (ERGOCALCIFEROL) 1.25 MG (50000 UNIT) PO CAPS
50000.0000 [IU] | ORAL_CAPSULE | ORAL | 0 refills | Status: AC
Start: 2022-12-02 — End: ?

## 2022-12-02 NOTE — Progress Notes (Unsigned)
Chief Complaint:   OBESITY Christy Randall is here to discuss her progress with her obesity treatment plan along with follow-up of her obesity related diagnoses. Christy Randall is on the Category 3 Plan and states she is following her eating plan approximately 10% of the time. Christy Randall states she is doing 0 minutes 0 times per week.  Today's visit was #: 6 Starting weight: 267 lbs Starting date: 08/24/2022 Today's weight: 254 lbs Today's date: 12/02/2022 Total lbs lost to date: 13 Total lbs lost since last in-office visit: 0  Interim History: Patient has not been able to concentrate on her diet as much recently.  She is watching her grandchildren on summer break and her back pain has increased.  She has done well with avoiding weight gain though.  Subjective:   1. Type 2 diabetes mellitus with other specified complication, without long-term current use of insulin (HCC) Patient's recent A1c was at 6.7 on Trulicity and with her diet.  She is unable to tolerate metformin due to significant diarrhea.  2. Vitamin D deficiency Patient's recent vitamin D level was below goal.  She is doing well on vitamin D prescription with no side effects noted.  Assessment/Plan:   1. Type 2 diabetes mellitus with other specified complication, without long-term current use of insulin (HCC) Patient agreed to increase Trulicity to 1.5 mg once weekly, and we will refill for 1 month.  - Dulaglutide (TRULICITY) 1.5 MG/0.5ML SOPN; Inject 1.5 mg into the skin once a week.  Dispense: 2 mL; Refill: 0  2. Vitamin D deficiency Patient will continue prescription vitamin D once weekly, and we will refill for 1 month.  - Vitamin D, Ergocalciferol, (DRISDOL) 1.25 MG (50000 UNIT) CAPS capsule; Take 1 capsule (50,000 Units total) by mouth every 7 (seven) days.  Dispense: 5 capsule; Refill: 0  3. BMI 40.0-44.9, adult (HCC) Current BMI 43.2  4. Obesity, Beginning BMI 44.6 Christy Randall is currently in the action stage of change. As such, her  goal is to continue with weight loss efforts. She has agreed to the Category 3 Plan.   Behavioral modification strategies: increasing lean protein intake and meal planning and cooking strategies.  Christy Randall has agreed to follow-up with our clinic in 4 weeks. She was informed of the importance of frequent follow-up visits to maximize her success with intensive lifestyle modifications for her multiple health conditions.   Objective:   Blood pressure 118/70, pulse 61, temperature 97.9 F (36.6 C), height 5\' 5"  (1.651 m), weight 254 lb (115.2 kg), SpO2 95 %. Body mass index is 42.27 kg/m.  Lab Results  Component Value Date   CREATININE 1.02 (H) 08/24/2022   BUN 21 08/24/2022   NA 140 08/24/2022   K 4.1 08/24/2022   CL 97 08/24/2022   CO2 28 08/24/2022   Lab Results  Component Value Date   ALT 13 08/24/2022   AST 17 08/24/2022   ALKPHOS 106 08/24/2022   BILITOT 0.4 08/24/2022   Lab Results  Component Value Date   HGBA1C 6.7 (H) 08/24/2022   HGBA1C 6.6 (H) 11/04/2016   Lab Results  Component Value Date   INSULIN 38.3 (H) 08/24/2022   Lab Results  Component Value Date   TSH 1.360 08/24/2022   Lab Results  Component Value Date   CHOL 150 08/24/2022   HDL 36 (L) 08/24/2022   LDLCALC 92 08/24/2022   TRIG 121 08/24/2022   Lab Results  Component Value Date   VD25OH 27.1 (L) 08/24/2022   Lab  Results  Component Value Date   WBC 8.2 08/24/2022   HGB 12.8 08/24/2022   HCT 39.0 08/24/2022   MCV 94 08/24/2022   PLT 255 08/24/2022   No results found for: "IRON", "TIBC", "FERRITIN"  Attestation Statements:   Reviewed by clinician on day of visit: allergies, medications, problem list, medical history, surgical history, family history, social history, and previous encounter notes.   I, Burt Knack, am acting as transcriptionist for Quillian Quince, MD.  I have reviewed the above documentation for accuracy and completeness, and I agree with the above. -  ***

## 2022-12-03 ENCOUNTER — Encounter: Payer: Self-pay | Admitting: Family Medicine

## 2022-12-07 ENCOUNTER — Encounter: Payer: Self-pay | Admitting: Family Medicine

## 2022-12-07 ENCOUNTER — Ambulatory Visit (INDEPENDENT_AMBULATORY_CARE_PROVIDER_SITE_OTHER): Payer: 59 | Admitting: Family Medicine

## 2022-12-07 VITALS — BP 110/78 | HR 55 | Ht 65.0 in | Wt 260.0 lb

## 2022-12-07 DIAGNOSIS — M545 Low back pain, unspecified: Secondary | ICD-10-CM | POA: Diagnosis not present

## 2022-12-07 DIAGNOSIS — M47816 Spondylosis without myelopathy or radiculopathy, lumbar region: Secondary | ICD-10-CM | POA: Diagnosis not present

## 2022-12-07 DIAGNOSIS — G8929 Other chronic pain: Secondary | ICD-10-CM

## 2022-12-07 NOTE — Progress Notes (Signed)
I, Stevenson Clinch, CMA acting as a scribe for [name], MD.  Christy Randall is a 57 y.o. female who presents to [facility] today for cont'd LBP. Pt was last seen by Dr. Denyse Amass on 11/09/22 and was advised to restart rosuvastatin and see if pain returned. Bilateral facet injections were also ordered and performed on 7/9. There was much delay in getting these injections approved by her insurance company.  Today, pt reports continued lower back pain radiating into the gluteal region. Reports on improvement of sx s/p back injection. Notes change sensation while sitting, feels like bugs are landing on the legs. Otherwise, denied new or worsening sx since last visit. Would like to discuss next steps.   Dx imaging: 11/01/22 Pelvis MRI             10/21/22 Bilat hips/pelvis XR 01/20/22 L-spine MRI             07/15/21 L-spine MRI  Pertinent review of systems: No fevers or chills  Relevant historical information: Diabetes   Exam:  BP 110/78   Pulse (!) 55   Ht 5\' 5"  (1.651 m)   Wt 260 lb (117.9 kg)   SpO2 96%   BMI 43.27 kg/m  General: Well Developed, well nourished, and in no acute distress.   MSK: L-spine nontender midline decreased lumbar motion.    Lab and Radiology Results  EXAM: MRI LUMBAR SPINE WITHOUT CONTRAST   TECHNIQUE: Multiplanar, multisequence MR imaging of the lumbar spine was performed. No intravenous contrast was administered.   COMPARISON:  07/15/2021   FINDINGS: Segmentation:  5 lumbar type vertebral bodies.   Alignment:  Normal   Vertebrae: No fracture or focal bone lesion. Mild discogenic endplate edema at the L5-S1 level could relate to low back pain. This is progressive since February of this year.   Conus medullaris and cauda equina: Conus extends to the L1 level. Conus and cauda equina appear normal.   Paraspinal and other soft tissues: Negative   Disc levels:   No disc level pathology from T11-12 through  L2-3.   L3-4: Shallow protrusion of the disc with a small herniation showing upward migration to the left of midline. Similar appearance to the study of February. Mild stenosis at this level that could possibly be symptomatic.   L4-5: Bulging of the disc more prominent towards the left. Mild facet and ligamentous hypertrophy. Mild narrowing of the lateral recesses left more than right but without visible neural compression. The facet arthritis could be a cause of back pain or referred facet syndrome pain.   L5-S1: Desiccation of the disc without bulge or herniation. Posterior annular fissure. Discogenic edema of the posterior endplates as described above, which could be associated with back pain. No compressive canal or foraminal narrowing. Bilateral facet osteoarthritis which could be painful.   IMPRESSION: L3-4: Similar appearance to the study of February. Shallow protrusion of the disc with a small herniation showing upward migration to the left of midline. Mild spinal stenosis at this level that could possibly be symptomatic.   L4-5: Disc bulge more prominent towards the left. Bilateral facet degeneration and hypertrophy. Mild narrowing of the lateral recesses but without visible neural compression. The facet arthritis could be symptomatic.   L5-S1: Disc degeneration with posterior annular tearing but no bulge or herniation. Discogenic endplate edema posteriorly, new since the study of 2023, which could be correlated with regional back pain. Facet osteoarthritis at this level could also be symptomatic.  Electronically Signed   By: Paulina Fusi M.D.   On: 01/21/2022 13:48  EXAM: MRI PELVIS WITHOUT CONTRAST   TECHNIQUE: Multiplanar multisequence MR imaging of the pelvis was performed. No intravenous contrast was administered.   COMPARISON:  Bilateral hip x-rays dated Oct 21, 2022.   FINDINGS: Bones: There is no evidence of acute fracture, dislocation  or avascular necrosis. No focal bone lesion. The visualized sacroiliac joints and symphysis pubis appear normal. Advanced lower lumbar facet arthropathy.   Articular cartilage and labrum   Articular cartilage: No focal chondral defect or subchondral signal abnormality identified.   Labrum: Grossly intact, although evaluation is limited due to lack of intra-articular fluid. No paralabral abnormality.   Joint or bursal effusion   Joint effusion: No significant hip joint effusion.   Bursae: No focal periarticular fluid collection.   Muscles and tendons   Muscles and tendons: The visualized gluteus, hamstring and iliopsoas tendons appear normal. No muscle edema or atrophy.   Other findings   Miscellaneous: The visualized internal pelvic contents appear unremarkable.   IMPRESSION: 1. Advanced lower lumbar facet arthropathy. Otherwise unremarkable MRI of the pelvis.     Electronically Signed   By: Obie Dredge M.D.   On: 11/09/2022 08:50   EXAM: FLUOROSCOPICALLY GUIDED BILATERAL L4 MEDIAL BRANCH BLOCKS AND BILATERAL L5 DORSAL RAMUS BLOCKS, IN PREPARATION FOR BILATERAL L5-S1 FACET RADIOFREQUENCY ABLATION   FLUOROSCOPY TIME:  Radiation Exposure Index (as provided by the fluoroscopic device): 19.7 mGy Kerma   TECHNIQUE: The procedure, risks, benefits, and alternatives were explained to the patient. Questions regarding the procedure were encouraged and answered. The patient understands and consents to the procedure.   An appropriate skin entry site was determined fluoroscopically and marked. Site prepped with betadine, draped in usual sterile fashion, and infiltrated locally with 1% lidocaine.   BILATERAL L4 MEDIAL BRANCH BLOCKS: A posterior oblique approach was taken to the junction of the superior articular process and transverse process on each side at L5 using 5 inch 22 gauge spinal needles, to lie along the course of the bilateral L4 medial branch nerves. 0.5  mL of 0.5% bupivacaine were injected at each site.   BILATERAL L5 DORSAL RAMUS BLOCKS: A posterior oblique approach was taken to the junction of the S1 superior articular process and sacral ala on each side using 5 inch 22 gauge spinal needles, to lie along the course of the bilateral L5 dorsal rami. 0.5 mL of 0.5% bupivacaine were injected at each site.   The procedure was well-tolerated.   IMPRESSION: 1. Technically successful medial branch/dorsal ramus blocks in preparation for bilateral L5-S1 facet radiofrequency ablation.   I will call the patient later today to assess for durable and sustained pain relief. An addendum will be placed at that time.   Electronically Signed: By: Obie Dredge M.D. On: 12/01/2022 11:59 ADDENDUM: I called the patient at 4 p.m., approximately 6 hours after the procedure. She has experienced minimal if any relief in her central lower back and buttock pain since the procedure. She is therefore not a good candidate for radiofrequency ablation.     Electronically Signed   By: Obie Dredge M.D.   On: 12/01/2022 16:11  Assessment and Plan: 57 y.o. female with chronic low back pain not improving with typical conservative management options.  Vonnie had medial branch block in preparation for ablation of bilateral L5-S1 facet joints with very little benefit.  Potential next targets could include facet joints L4-5 bilaterally.  She may be a  candidate for epidural steroid injection or nerve root block.  We talked about options.  Plan to refer to pain management Dr. Lorrine Kin to discuss these potential procedures or even have spinal nerve stimulator.  We also talked about self-guided cognitive behavioral therapy for chronic pain.   PDMP not reviewed this encounter. Orders Placed This Encounter  Procedures   Ambulatory referral to Pain Clinic    Referral Priority:   Routine    Referral Type:   Consultation    Referral Reason:   Specialty Services  Required    Referred to Provider:   Renaldo Fiddler, MD    Requested Specialty:   Pain Medicine    Number of Visits Requested:   1   No orders of the defined types were placed in this encounter.    Discussed warning signs or symptoms. Please see discharge instructions. Patient expresses understanding.   The above documentation has been reviewed and is accurate and complete [name], M.D. Total encounter time 30 minutes including face-to-face time with the patient and, reviewing past medical record, and charting on the date of service.

## 2022-12-07 NOTE — Patient Instructions (Addendum)
Thank you for coming in today.   You should hear from Dr Kathlene Cote office this week.  Please let me know if you have any trouble.   Look up Dr The Pain Management Workbook: Powerful CBT and Mindfulness Skills to Take Control of Pain and Reclaim Your Life Paperback - December 1, 2020[employer]ness's workbook for chronic pain.   ThisMLS.nl

## 2022-12-09 ENCOUNTER — Other Ambulatory Visit (INDEPENDENT_AMBULATORY_CARE_PROVIDER_SITE_OTHER): Payer: Self-pay | Admitting: Family Medicine

## 2022-12-09 DIAGNOSIS — B372 Candidiasis of skin and nail: Secondary | ICD-10-CM

## 2022-12-18 DIAGNOSIS — E1142 Type 2 diabetes mellitus with diabetic polyneuropathy: Secondary | ICD-10-CM | POA: Diagnosis not present

## 2022-12-18 DIAGNOSIS — I10 Essential (primary) hypertension: Secondary | ICD-10-CM | POA: Diagnosis not present

## 2022-12-18 DIAGNOSIS — Z6841 Body Mass Index (BMI) 40.0 and over, adult: Secondary | ICD-10-CM | POA: Diagnosis not present

## 2022-12-18 DIAGNOSIS — E039 Hypothyroidism, unspecified: Secondary | ICD-10-CM | POA: Diagnosis not present

## 2022-12-18 DIAGNOSIS — J449 Chronic obstructive pulmonary disease, unspecified: Secondary | ICD-10-CM | POA: Diagnosis not present

## 2022-12-18 DIAGNOSIS — Z008 Encounter for other general examination: Secondary | ICD-10-CM | POA: Diagnosis not present

## 2022-12-18 DIAGNOSIS — Z299 Encounter for prophylactic measures, unspecified: Secondary | ICD-10-CM | POA: Diagnosis not present

## 2022-12-18 DIAGNOSIS — M545 Low back pain, unspecified: Secondary | ICD-10-CM | POA: Diagnosis not present

## 2022-12-18 DIAGNOSIS — Z87892 Personal history of anaphylaxis: Secondary | ICD-10-CM | POA: Diagnosis not present

## 2022-12-18 DIAGNOSIS — E1122 Type 2 diabetes mellitus with diabetic chronic kidney disease: Secondary | ICD-10-CM | POA: Diagnosis not present

## 2022-12-18 DIAGNOSIS — M25551 Pain in right hip: Secondary | ICD-10-CM | POA: Diagnosis not present

## 2022-12-18 DIAGNOSIS — I509 Heart failure, unspecified: Secondary | ICD-10-CM | POA: Diagnosis not present

## 2022-12-18 DIAGNOSIS — F17210 Nicotine dependence, cigarettes, uncomplicated: Secondary | ICD-10-CM | POA: Diagnosis not present

## 2022-12-18 DIAGNOSIS — M1990 Unspecified osteoarthritis, unspecified site: Secondary | ICD-10-CM | POA: Diagnosis not present

## 2022-12-18 DIAGNOSIS — E785 Hyperlipidemia, unspecified: Secondary | ICD-10-CM | POA: Diagnosis not present

## 2022-12-18 DIAGNOSIS — Z7985 Long-term (current) use of injectable non-insulin antidiabetic drugs: Secondary | ICD-10-CM | POA: Diagnosis not present

## 2022-12-18 DIAGNOSIS — I129 Hypertensive chronic kidney disease with stage 1 through stage 4 chronic kidney disease, or unspecified chronic kidney disease: Secondary | ICD-10-CM | POA: Diagnosis not present

## 2022-12-25 ENCOUNTER — Other Ambulatory Visit (INDEPENDENT_AMBULATORY_CARE_PROVIDER_SITE_OTHER): Payer: Self-pay | Admitting: Family Medicine

## 2022-12-25 DIAGNOSIS — E1169 Type 2 diabetes mellitus with other specified complication: Secondary | ICD-10-CM

## 2022-12-28 DIAGNOSIS — M5136 Other intervertebral disc degeneration, lumbar region: Secondary | ICD-10-CM | POA: Diagnosis not present

## 2022-12-28 DIAGNOSIS — Z6841 Body Mass Index (BMI) 40.0 and over, adult: Secondary | ICD-10-CM | POA: Diagnosis not present

## 2022-12-30 ENCOUNTER — Ambulatory Visit (INDEPENDENT_AMBULATORY_CARE_PROVIDER_SITE_OTHER): Payer: 59 | Admitting: Family Medicine

## 2023-01-01 ENCOUNTER — Other Ambulatory Visit (INDEPENDENT_AMBULATORY_CARE_PROVIDER_SITE_OTHER): Payer: Self-pay | Admitting: Physician Assistant

## 2023-01-01 DIAGNOSIS — E559 Vitamin D deficiency, unspecified: Secondary | ICD-10-CM

## 2023-01-12 DIAGNOSIS — M47816 Spondylosis without myelopathy or radiculopathy, lumbar region: Secondary | ICD-10-CM | POA: Diagnosis not present

## 2023-01-21 DIAGNOSIS — I10 Essential (primary) hypertension: Secondary | ICD-10-CM | POA: Diagnosis not present

## 2023-01-21 DIAGNOSIS — M25551 Pain in right hip: Secondary | ICD-10-CM | POA: Diagnosis not present

## 2023-01-21 DIAGNOSIS — M25552 Pain in left hip: Secondary | ICD-10-CM | POA: Diagnosis not present

## 2023-01-21 DIAGNOSIS — Z299 Encounter for prophylactic measures, unspecified: Secondary | ICD-10-CM | POA: Diagnosis not present

## 2023-01-26 DIAGNOSIS — E1165 Type 2 diabetes mellitus with hyperglycemia: Secondary | ICD-10-CM | POA: Diagnosis not present

## 2023-01-26 DIAGNOSIS — I10 Essential (primary) hypertension: Secondary | ICD-10-CM | POA: Diagnosis not present

## 2023-01-26 DIAGNOSIS — E1142 Type 2 diabetes mellitus with diabetic polyneuropathy: Secondary | ICD-10-CM | POA: Diagnosis not present

## 2023-01-26 DIAGNOSIS — Z299 Encounter for prophylactic measures, unspecified: Secondary | ICD-10-CM | POA: Diagnosis not present

## 2023-01-27 DIAGNOSIS — M47816 Spondylosis without myelopathy or radiculopathy, lumbar region: Secondary | ICD-10-CM | POA: Diagnosis not present

## 2023-01-28 DIAGNOSIS — Z299 Encounter for prophylactic measures, unspecified: Secondary | ICD-10-CM | POA: Diagnosis not present

## 2023-01-28 DIAGNOSIS — I10 Essential (primary) hypertension: Secondary | ICD-10-CM | POA: Diagnosis not present

## 2023-01-28 DIAGNOSIS — M5441 Lumbago with sciatica, right side: Secondary | ICD-10-CM | POA: Diagnosis not present

## 2023-02-02 ENCOUNTER — Other Ambulatory Visit (INDEPENDENT_AMBULATORY_CARE_PROVIDER_SITE_OTHER): Payer: Self-pay | Admitting: Physician Assistant

## 2023-02-02 DIAGNOSIS — E559 Vitamin D deficiency, unspecified: Secondary | ICD-10-CM

## 2023-02-11 DIAGNOSIS — M47816 Spondylosis without myelopathy or radiculopathy, lumbar region: Secondary | ICD-10-CM | POA: Diagnosis not present

## 2023-03-11 DIAGNOSIS — M47816 Spondylosis without myelopathy or radiculopathy, lumbar region: Secondary | ICD-10-CM | POA: Diagnosis not present

## 2023-03-11 DIAGNOSIS — Z6841 Body Mass Index (BMI) 40.0 and over, adult: Secondary | ICD-10-CM | POA: Diagnosis not present

## 2023-03-11 DIAGNOSIS — M5416 Radiculopathy, lumbar region: Secondary | ICD-10-CM | POA: Diagnosis not present

## 2023-04-30 ENCOUNTER — Other Ambulatory Visit: Payer: Self-pay | Admitting: Internal Medicine

## 2023-04-30 DIAGNOSIS — Z1231 Encounter for screening mammogram for malignant neoplasm of breast: Secondary | ICD-10-CM

## 2023-05-10 ENCOUNTER — Ambulatory Visit
Admission: RE | Admit: 2023-05-10 | Discharge: 2023-05-10 | Disposition: A | Discharge status: Home / Self Care | Payer: 59 | Source: Ambulatory Visit | Attending: Internal Medicine | Admitting: Internal Medicine

## 2023-05-10 DIAGNOSIS — Z1231 Encounter for screening mammogram for malignant neoplasm of breast: Secondary | ICD-10-CM

## 2023-05-17 DIAGNOSIS — R260 Ataxic gait: Secondary | ICD-10-CM | POA: Diagnosis not present

## 2023-05-17 DIAGNOSIS — R42 Dizziness and giddiness: Secondary | ICD-10-CM | POA: Diagnosis not present

## 2023-05-17 DIAGNOSIS — R0602 Shortness of breath: Secondary | ICD-10-CM | POA: Diagnosis not present

## 2023-05-17 DIAGNOSIS — R270 Ataxia, unspecified: Secondary | ICD-10-CM | POA: Diagnosis not present

## 2023-06-16 ENCOUNTER — Other Ambulatory Visit: Payer: Self-pay

## 2023-06-16 DIAGNOSIS — Z87891 Personal history of nicotine dependence: Secondary | ICD-10-CM

## 2023-06-16 DIAGNOSIS — Z122 Encounter for screening for malignant neoplasm of respiratory organs: Secondary | ICD-10-CM

## 2023-07-29 ENCOUNTER — Ambulatory Visit (INDEPENDENT_AMBULATORY_CARE_PROVIDER_SITE_OTHER): Admitting: Podiatry

## 2023-07-29 ENCOUNTER — Encounter: Payer: Self-pay | Admitting: Podiatry

## 2023-07-29 DIAGNOSIS — L600 Ingrowing nail: Secondary | ICD-10-CM | POA: Diagnosis not present

## 2023-07-29 DIAGNOSIS — E1142 Type 2 diabetes mellitus with diabetic polyneuropathy: Secondary | ICD-10-CM

## 2023-07-29 DIAGNOSIS — L84 Corns and callosities: Secondary | ICD-10-CM

## 2023-07-29 NOTE — Patient Instructions (Signed)
 VISIT SUMMARY:  Today, we discussed your ongoing foot pain and toenail issues. We reviewed your diabetic neuropathy and the severe discomfort it causes, especially at night. We also addressed your ingrown toenail and the dryness on your heels. Additionally, we talked about the impact of smoking on your circulation and neuropathy symptoms.  YOUR PLAN:  -DIABETIC NEUROPATHY: Diabetic neuropathy is nerve damage caused by high blood sugar levels, leading to pain and loss of sensation. We discussed trying a spinal cord stimulator for pain management and considering topical treatments like lidocaine or gabapentin cream. You might also find relief using vibration pads or rollers.  -INGROWN TOENAIL: An ingrown toenail occurs when the edge of the toenail grows into the skin, causing discomfort. We recommend trimming the affected toenail to alleviate the issue.  -PLANTAR HEEL CALLUS: A plantar heel callus is a thickened area of skin on the heel, often due to pressure or friction. We suggest using a 40% urea cream and a pumice stone after bathing to remove dead skin. Apply the urea cream after using the pumice stone and let it dry before putting on socks and shoes.  -SMOKING: Smoking can worsen circulation issues and neuropathy symptoms by affecting small blood vessels and nerve health. We strongly encourage you to quit smoking to improve your overall health and reduce these symptoms.  INSTRUCTIONS:  Please follow up with Dr. Belva Bertin to discuss the spinal cord stimulator trial for your diabetic neuropathy. Continue with the recommended treatments for your heel callus and ingrown toenail. Consider using topical medications for your neuropathy symptoms and explore vibration pads or rollers for relief. Lastly, we urge you to take steps towards smoking cessation to improve your circulation and nerve health.  For more information, you can read your full clinical note, available in your patient portal.

## 2023-07-29 NOTE — Progress Notes (Signed)
  Subjective:  Patient ID: Christy Randall, female    DOB: May 27, 1965,  MRN: [id number]  Chief Complaint  Patient presents with   Diabetes    "My heels are really rough and scaly.  I have an ingrown toenail on either the second or third toe on my left foot.  I have Diabetic Neuropathy.  I don't know what to do about it."    Discussed the use of AI scribe software for clinical note transcription with the patient, who gave verbal consent to proceed.  History of Present Illness   Christy Randall is a 58 year old female with diabetic neuropathy who presents with foot pain and toenail issues.  She experiences severe foot pain, particularly at night, described as a burning, tingling sensation akin to 'pins and needles.' Her feet feel constantly 'asleep' on the bottom. Deep pressure provides some relief. She has diabetic neuropathy and has tried amitriptyline, gabapentin, and Lyrica without significant relief. Lyrica caused cognitive difficulties, including 'brain fog' and trouble understanding written information. Gabapentin was taken up to 1800 mg per day without improvement.  She has issues with an ingrown toenail, which feels 'weird' but is not currently painful. She has not had her circulation checked and is unsure of the process involved.  She reports dryness in her heels, which developed quickly and is not associated with itching or burning. She is not using any treatment for this condition.  She smokes and has a history of severe back problems, for which she received injections and a nerve ablation procedure in September that provided only temporary relief. She has not undergone back surgery.          Objective:    Physical Exam   CARDIOVASCULAR: Palpable PT and DP pulse. Delayed capillary refill time in toes. EXTREMITIES: Incurated dystrophic second toenail without infection and large diffuse callus on plantar heel. NEUROLOGICAL: Diffuse peripheral neuropathy with loss of sensation  and severe paresthesias.       No images are attached to the encounter.    Results   Procedure: Toenail Trimming Description: Trimmed the distal portion of the second toenail. No signs of infection or severe ingrowth observed.  LABS HbA1c: 6.0%      Assessment:   1. Ingrown nail of second toe of left foot   2. Diabetic peripheral neuropathy (HCC)   3. Callus of foot      Plan:  Patient was evaluated and treated and all questions answered.  Assessment and Plan    Diabetic Neuropathy Chronic diabetic neuropathy with severe paresthesias and loss of protective sensation. Previous treatments ineffective. Discussed spinal cord stimulator trial for pain management. - Discuss spinal cord stimulator with Dr. Belva Bertin. - Consider topical medications such as lidocaine cream or compounded gabapentin cream. - Explore vibration pads or rollers for symptomatic relief.  Ingrown Toenail Incurated dystrophic second toenail causing discomfort. - Trim the affected toenail.  Plantar Heel Callus Large diffuse callus on plantar heel with dryness. - Recommend 40% urea cream for the heels. - Use a pumice stone after bathing to remove dead skin. - Apply urea cream after using the pumice stone and allow it to dry before wearing socks and shoes.  Smoking Current smoker, exacerbating circulation issues and neuropathy symptoms. Discussed impact on small vessel circulation and nerve nourishment. - Encourage smoking cessation.          Return if symptoms worsen or fail to improve.

## 2023-08-18 ENCOUNTER — Ambulatory Visit (HOSPITAL_COMMUNITY)
Admission: RE | Admit: 2023-08-18 | Discharge: 2023-08-18 | Disposition: A | Discharge status: Home / Self Care | Source: Ambulatory Visit | Attending: Oncology | Admitting: Oncology

## 2023-08-18 DIAGNOSIS — Z122 Encounter for screening for malignant neoplasm of respiratory organs: Secondary | ICD-10-CM | POA: Diagnosis present

## 2023-08-18 DIAGNOSIS — Z87891 Personal history of nicotine dependence: Secondary | ICD-10-CM | POA: Insufficient documentation

## 2023-09-02 ENCOUNTER — Other Ambulatory Visit: Payer: Self-pay | Admitting: Neurosurgery

## 2023-09-02 ENCOUNTER — Encounter: Payer: Self-pay | Admitting: Neurosurgery

## 2023-09-02 DIAGNOSIS — M5416 Radiculopathy, lumbar region: Secondary | ICD-10-CM

## 2023-09-09 ENCOUNTER — Ambulatory Visit
Admission: RE | Admit: 2023-09-09 | Discharge: 2023-09-09 | Disposition: A | Discharge status: Home / Self Care | Source: Ambulatory Visit | Attending: Neurosurgery | Admitting: Neurosurgery

## 2023-09-09 DIAGNOSIS — M5416 Radiculopathy, lumbar region: Secondary | ICD-10-CM

## 2023-09-17 ENCOUNTER — Telehealth: Payer: Self-pay | Admitting: *Deleted

## 2023-09-17 NOTE — Telephone Encounter (Signed)
 Created in error

## 2023-09-23 ENCOUNTER — Encounter: Payer: Self-pay | Admitting: *Deleted

## 2024-01-05 ENCOUNTER — Other Ambulatory Visit: Payer: Self-pay | Admitting: Medical Genetics

## 2024-01-10 ENCOUNTER — Other Ambulatory Visit (HOSPITAL_COMMUNITY)
Admission: RE | Admit: 2024-01-10 | Discharge: 2024-01-10 | Disposition: A | Discharge status: Home / Self Care | Payer: Self-pay | Source: Ambulatory Visit | Attending: Oncology | Admitting: Oncology

## 2024-01-17 LAB — GENECONNECT MOLECULAR SCREEN: Genetic Analysis Overall Interpretation: NEGATIVE

## 2024-02-15 IMAGING — MR MR LUMBAR SPINE W/O CM
5 series · 33 of 48 positions shown · non-contrast
Comparison: Prior lumbar spine MRIs could not be retrieved for
comparison

CLINICAL DATA: Low back pain radiating to left hip

EXAM:
MRI LUMBAR SPINE WITHOUT CONTRAST
TECHNIQUE: Multiplanar, multisequence MR imaging of the lumbar spine was
performed. No intravenous contrast was administered.

[Series 5: T2 · sagittal · 4.0mm · 0.81mm/px · 6 of 16 slices shown (1 of 2)]
[im 1/16]
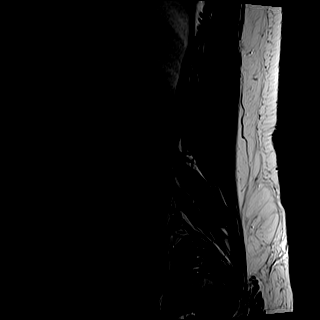
[im 4/16]
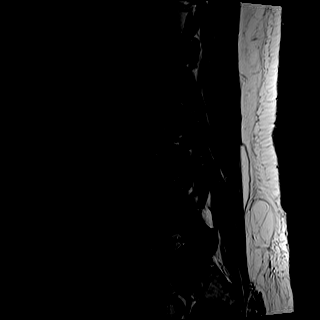
[im 7/16]
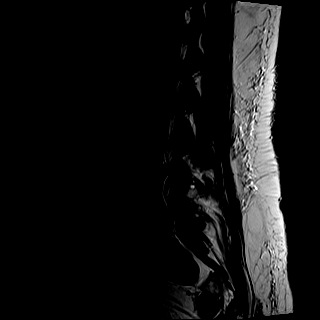
[im 10/16]
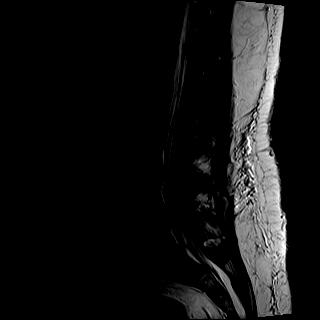
[im 13/16]
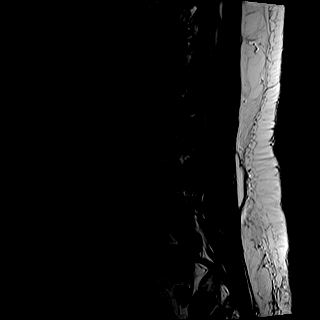
[im 16/16]
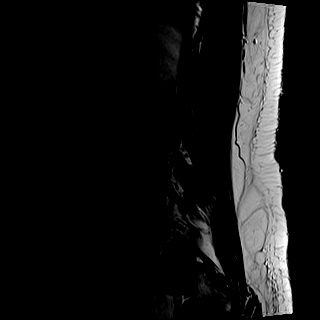

[Series 6: STIR · sagittal · 4.0mm · 0.51mm/px · 4 of 16 slices shown]
[im 1/16]
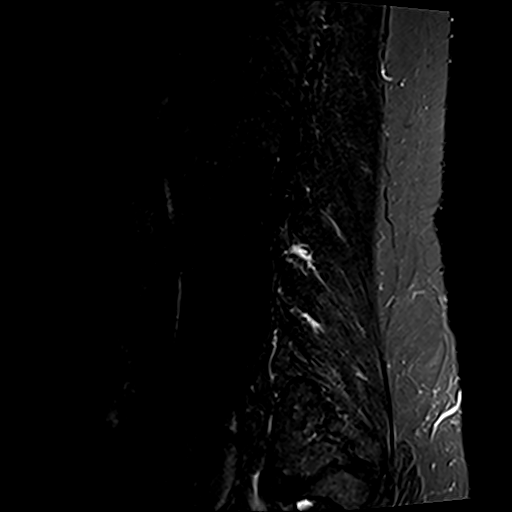
[im 3/16]
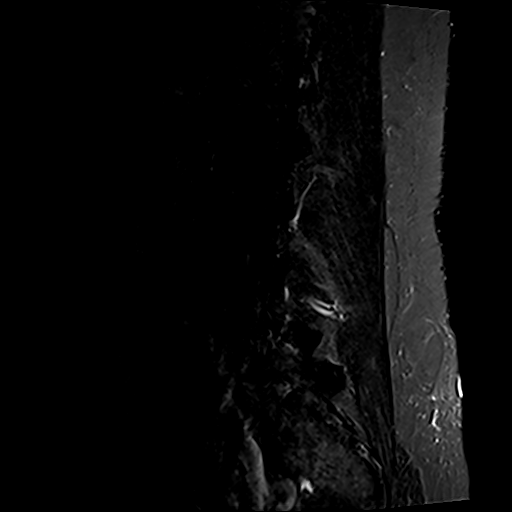
[im 6/16]
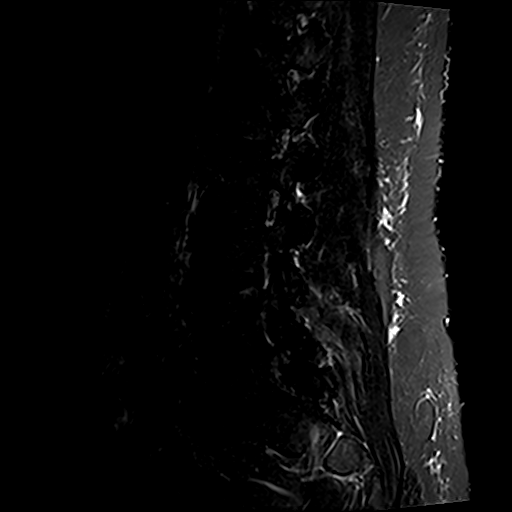
[im 8/16]
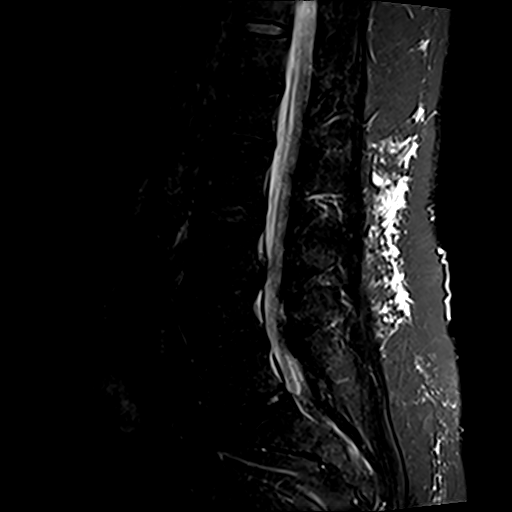

[Series 7: T1 · sagittal · 4.0mm · 1.02mm/px · 7 of 16 slices shown (1 of 2)]
[im 1/16]
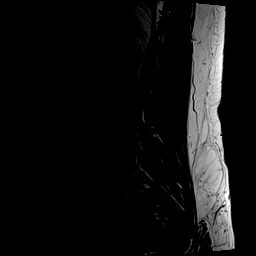
[im 3/16]
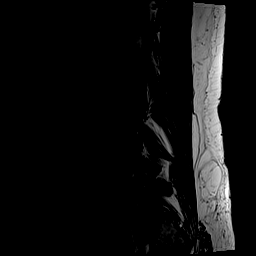
[im 6/16]
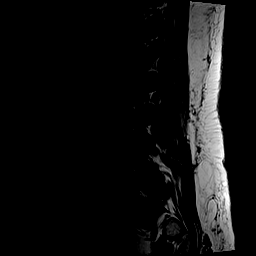
[im 8/16]
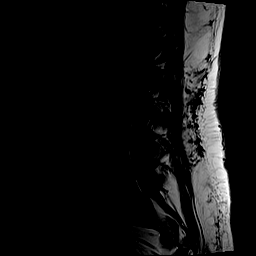
[im 11/16]
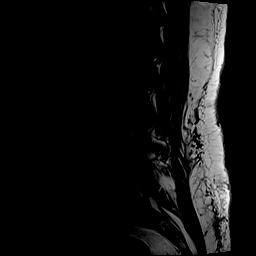
[im 13/16]
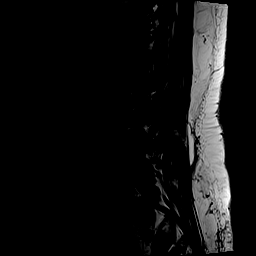
[im 16/16]
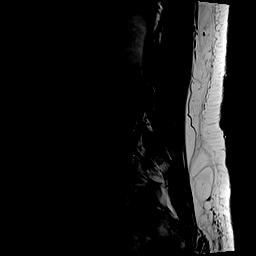

[Series 8: T2 · axial · 4.0mm · 0.70mm/px · z∈[-83,+113]mm · 8 of 34 slices shown (2 of 2)]
[im 1/34]
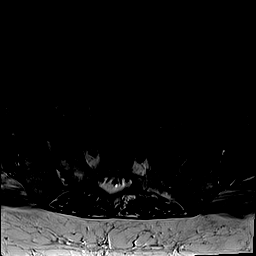
[im 6/34]
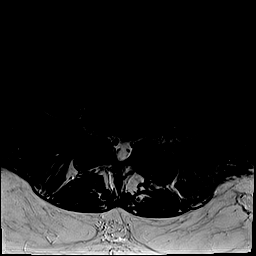
[im 11/34]
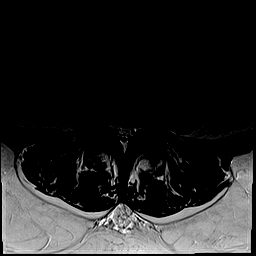
[im 16/34]
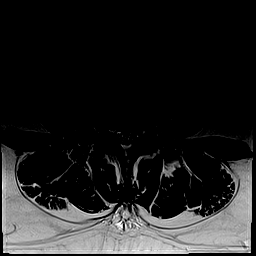
[im 18/34]
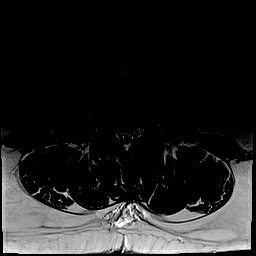
[im 23/34]
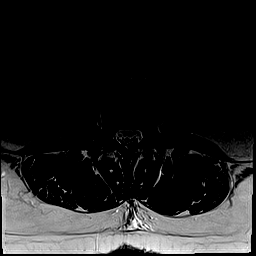
[im 28/34]
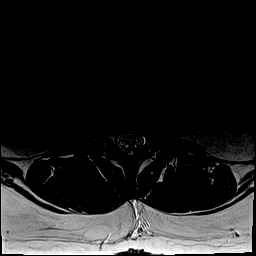
[im 34/34]
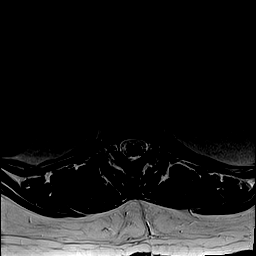

[Series 9: T1 · axial · 4.0mm · 0.35mm/px · z∈[-83,+113]mm · 8 of 34 slices shown (2 of 2)]
[im 1/34]
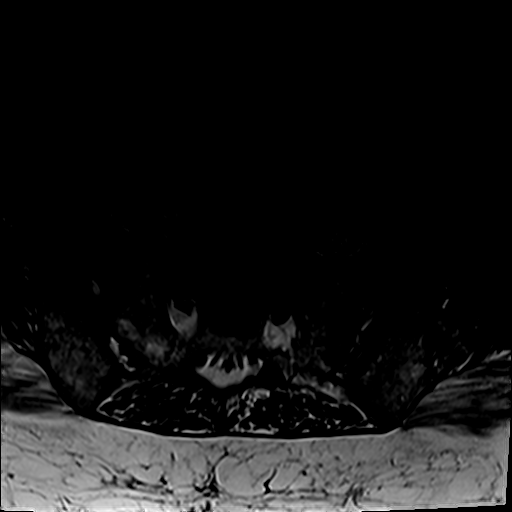
[im 6/34]
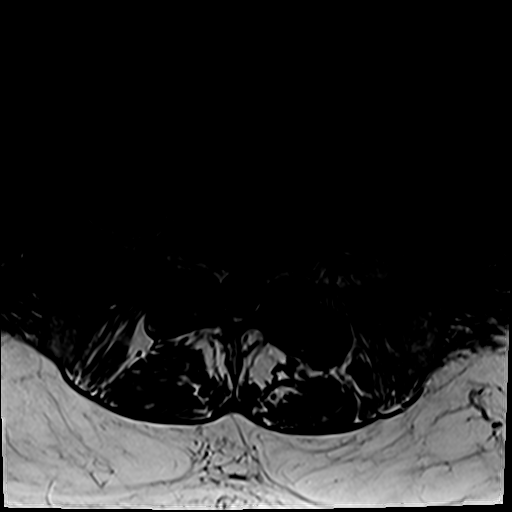
[im 11/34]
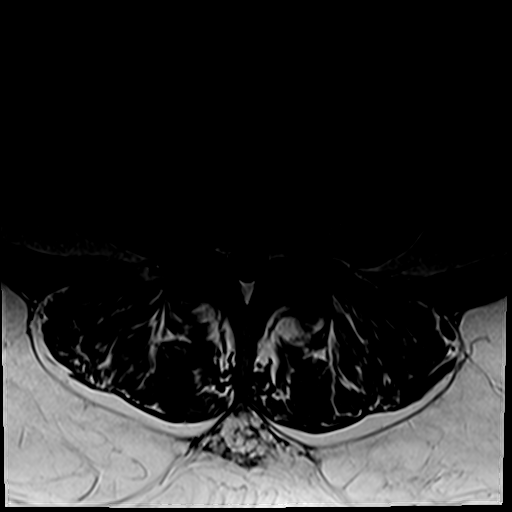
[im 16/34]
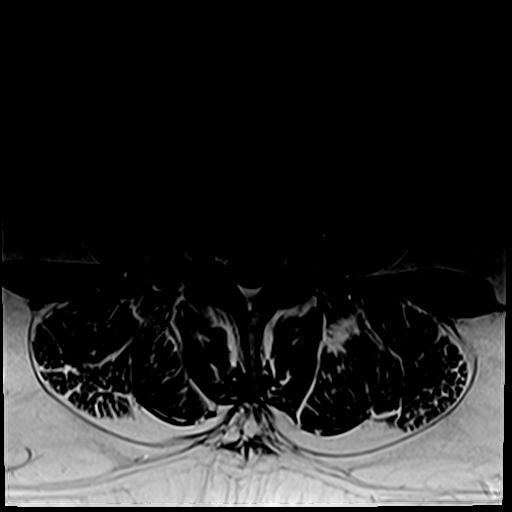
[im 18/34]
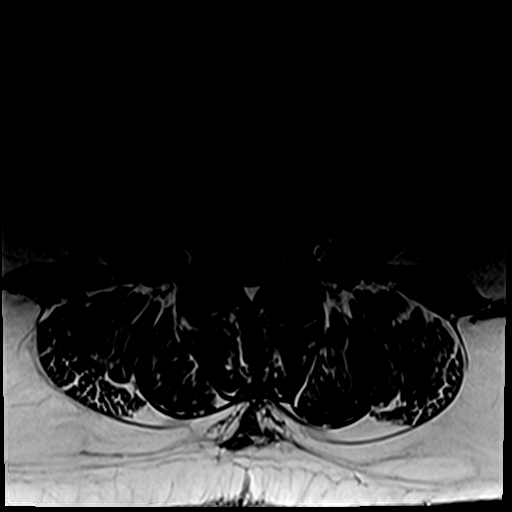
[im 23/34]
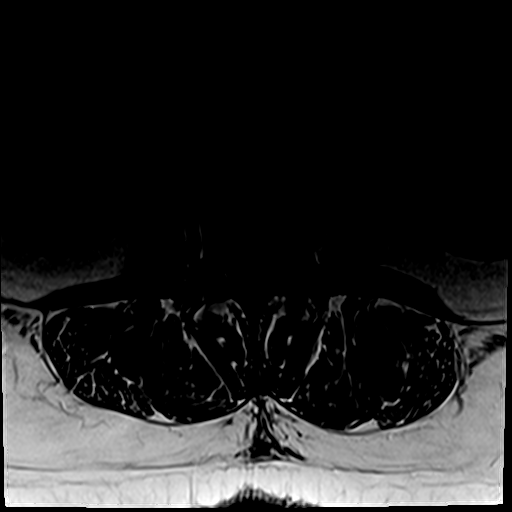
[im 28/34]
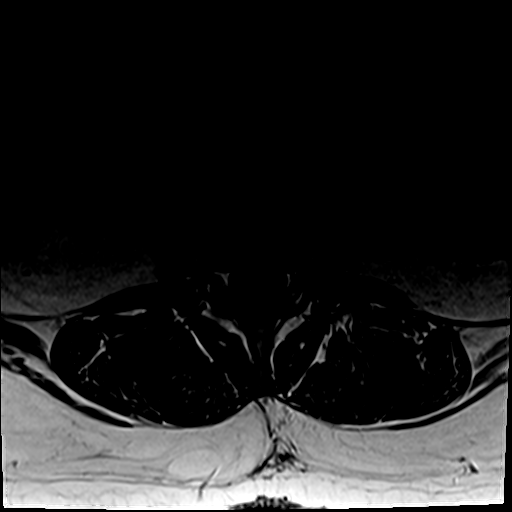
[im 34/34]
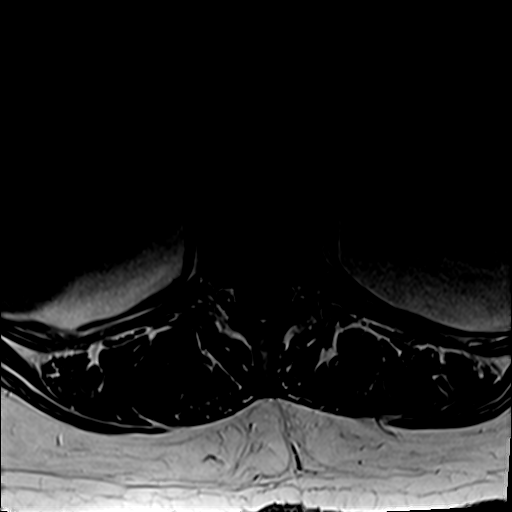

[33 of 48 positions shown; findings below may reference images not displayed]

FINDINGS: Segmentation:  Standard.

Alignment:  Mild levocurvature.  No listhesis.

Vertebrae:  No acute fracture or suspicious osseous lesion.

Conus medullaris and cauda equina: Conus extends to the L1-L2 level.
Conus and cauda equina appear normal.

Paraspinal and other soft tissues: Negative.

Disc levels:

T12-L1: No significant disc bulge. No spinal canal stenosis or
neural foraminal narrowing.

L1-L2: No significant disc bulge. No spinal canal stenosis or neural
foraminal narrowing.

L2-L3: No significant disc bulge. No spinal canal stenosis or neural
foraminal narrowing.

L3-L4: Moderate disc bulge with superimposed left subarticular disc
extrusion with cranial migration. Mild facet arthropathy. Mild
spinal canal stenosis. Narrowing of the lateral recesses. No neural
foraminal narrowing.

L4-L5: Minimal disc bulge. Moderate facet arthropathy. Narrowing of
the lateral recesses. No spinal canal stenosis. Moderate right
neural foraminal narrowing.

L5-S1: No significant disc bulge. Severe left-greater-than-right
facet arthropathy. No spinal canal stenosis or neural foraminal
narrowing.
IMPRESSION: 1. L3-L4 mild spinal canal stenosis. Narrowing of the lateral
recesses could affect the descending L4 nerve roots.
2. L4-L5 moderate right neural foraminal narrowing. Narrowing of the
lateral recesses at this level could affect the descending L5 nerve
roots.

## 2024-02-16 ENCOUNTER — Other Ambulatory Visit: Payer: Self-pay | Admitting: Pain Medicine

## 2024-02-18 ENCOUNTER — Other Ambulatory Visit (HOSPITAL_COMMUNITY): Payer: Self-pay | Admitting: Internal Medicine

## 2024-02-18 ENCOUNTER — Ambulatory Visit (HOSPITAL_COMMUNITY)
Admission: RE | Admit: 2024-02-18 | Discharge: 2024-02-18 | Disposition: A | Discharge status: Home / Self Care | Source: Ambulatory Visit | Attending: Internal Medicine | Admitting: Internal Medicine

## 2024-02-18 DIAGNOSIS — M25552 Pain in left hip: Secondary | ICD-10-CM

## 2024-03-15 NOTE — Progress Notes (Signed)
 Surgical Instructions   Your procedure is scheduled on Wednesday March 29, 2024. Report to [facility] Main Entrance A at 1:00 P.M. then check in with the Admitting office. Any questions or running late day of surgery: call [phone]  Questions prior to your surgery date: call [phone], Monday-Friday, 8am-4pm. If you experience any cold or flu symptoms such as cough, fever, chills, shortness of breath, etc. between now and your scheduled surgery, please notify us  at the above number.     Remember:  Do not eat or drink after midnight the night before your surgery   Take these medicines the morning of surgery with A SIP OF WATER   amLODipine (NORVASC)  DULoxetine (CYMBALTA)  levothyroxine  (SYNTHROID )  pantoprazole (PROTONIX)  rosuvastatin (CRESTOR)  varenicline (CHANTIX)    May take these medicines IF NEEDED: ondansetron  (ZOFRAN -ODT)  traMADol (ULTRAM)    Follow your surgeon's instructions on when to stop Asprin.  If no instructions were given by your surgeon then you will need to call the office to get those instructions.     One week prior to surgery, STOP taking any Aleve, Naproxen, Ibuprofen , Motrin , Advil , Goody's, BC's, all herbal medications, fish oil, and non-prescription vitamins.   WHAT DO I DO ABOUT MY DIABETES MEDICATION?   Do not take oral diabetes medicines (pills) the morning of surgery.         HOLD YOUR Semaglutide, 2 MG/DOSE, (OZEMPIC, 2 MG/DOSE,)  7 DAYS PRIOR TO SURGERY WITH THE LAST DOSE BEING NO LATER THAN 03/21/2024.   The day of surgery, do not take other diabetes injectables, including Byetta (exenatide), Bydureon (exenatide ER), Victoza (liraglutide), or Trulicity  (dulaglutide ).  If your CBG is greater than 220 mg/dL, you may take  of your sliding scale (correction) dose of insulin .   HOW TO MANAGE YOUR DIABETES BEFORE AND AFTER SURGERY  Why is it important to control my blood sugar before and after surgery? Improving blood sugar  levels before and after surgery helps healing and can limit problems. A way of improving blood sugar control is eating a healthy diet by:  Eating less sugar and carbohydrates  Increasing activity/exercise  Talking with your doctor about reaching your blood sugar goals High blood sugars (greater than 180 mg/dL) can raise your risk of infections and slow your recovery, so you will need to focus on controlling your diabetes during the weeks before surgery. Make sure that the doctor who takes care of your diabetes knows about your planned surgery including the date and location.  How do I manage my blood sugar before surgery? Check your blood sugar at least 4 times a day, starting 2 days before surgery, to make sure that the level is not too high or low.  Check your blood sugar the morning of your surgery when you wake up and every 2 hours until you get to the Short Stay unit.  If your blood sugar is less than 70 mg/dL, you will need to treat for low blood sugar: Do not take insulin . Treat a low blood sugar (less than 70 mg/dL) with  cup of clear juice (cranberry or apple), 4 glucose tablets, OR glucose gel. Recheck blood sugar in 15 minutes after treatment (to make sure it is greater than 70 mg/dL). If your blood sugar is not greater than 70 mg/dL on recheck, call [phone] for further instructions. Report your blood sugar to the short stay nurse when you get to Short Stay.  If you are admitted to the hospital after surgery: Your blood  sugar will be checked by the staff and you will probably be given insulin  after surgery (instead of oral diabetes medicines) to make sure you have good blood sugar levels. The goal for blood sugar control after surgery is 80-180 mg/dL.                      Do NOT Smoke (Tobacco/Vaping) for 24 hours prior to your procedure.  If you use a CPAP at night, you may bring your mask/headgear for your overnight stay.   You will be asked to remove any contacts,  glasses, piercing's, hearing aid's, dentures/partials prior to surgery. Please bring cases for these items if needed.    Patients discharged the day of surgery will not be allowed to drive home, and someone needs to stay with them for 24 hours.  SURGICAL WAITING ROOM VISITATION Patients may have no more than 2 support people in the waiting area - these visitors may rotate.   Pre-op nurse will coordinate an appropriate time for 1 ADULT support person, who may not rotate, to accompany patient in pre-op.  Children under the age of 50 must have an adult with them who is not the patient and must remain in the main waiting area with an adult.  If the patient needs to stay at the hospital during part of their recovery, the visitor guidelines for inpatient rooms apply.  Please refer to the [facility] website for the visitor guidelines for any additional information.   If you received a COVID test during your pre-op visit  it is requested that you wear a mask when out in public, stay away from anyone that may not be feeling well and notify your surgeon if you develop symptoms. If you have been in contact with anyone that has tested positive in the last 10 days please notify you surgeon.      Pre-operative CHG Bathing Instructions   You can play a key role in reducing the risk of infection after surgery. Your skin needs to be as free of germs as possible. You can reduce the number of germs on your skin by washing with CHG (chlorhexidine  gluconate) soap before surgery. CHG is an antiseptic soap that kills germs and continues to kill germs even after washing.   DO NOT use if you have an allergy to chlorhexidine /CHG or antibacterial soaps. If your skin becomes reddened or irritated, stop using the CHG and notify one of our RNs at [phone].              TAKE A SHOWER THE NIGHT BEFORE SURGERY   Please keep in mind the following:  DO NOT shave, including legs and underarms, 48 hours prior to  surgery.   Place clean sheets on your bed the night before surgery Use a clean washcloth (not used since being washed) for shower. DO NOT sleep with pet's night before surgery.  CHG Shower Instructions:  Wash your face and private area with normal soap. If you choose to wash your hair, wash first with your normal shampoo.  After you use shampoo/soap, rinse your hair and body thoroughly to remove shampoo/soap residue.  Turn the water  OFF and apply half the bottle of CHG soap to a CLEAN washcloth.  Apply CHG soap ONLY FROM YOUR NECK DOWN TO YOUR TOES (washing for 3-5 minutes)  DO NOT use CHG soap on face, private areas, open wounds, or sores.  Pay special attention to the area where your surgery is being performed.  If you are having back surgery, having someone wash your back for you may be helpful. Wait 2 minutes after CHG soap is applied, then you may rinse off the CHG soap.  Pat dry with a clean towel  Put on clean pajamas    Additional instructions for the day of surgery: If you choose, you may shower the morning of surgery with an antibacterial soap.  DO NOT APPLY any lotions, deodorants or perfumes.   Do not wear jewelry or makeup Do not wear nail polish, gel polish, artificial nails, or any other type of covering on natural nails (fingers and toes) Do not bring valuables to the hospital. [facility] is not responsible for valuables/personal belongings. Put on clean/comfortable clothes.  Please brush your teeth.  Ask your nurse before applying any prescription medications to the skin.

## 2024-03-16 ENCOUNTER — Encounter (HOSPITAL_COMMUNITY)
Admission: RE | Admit: 2024-03-16 | Discharge: 2024-03-16 | Disposition: A | Discharge status: Home / Self Care | Source: Ambulatory Visit | Attending: Pain Medicine | Admitting: Pain Medicine

## 2024-03-16 ENCOUNTER — Other Ambulatory Visit: Payer: Self-pay

## 2024-03-16 ENCOUNTER — Encounter (HOSPITAL_COMMUNITY): Payer: Self-pay

## 2024-03-16 VITALS — BP 104/61 | HR 59 | Temp 98.3°F | Resp 16 | Ht 66.0 in | Wt 248.3 lb

## 2024-03-16 DIAGNOSIS — E119 Type 2 diabetes mellitus without complications: Secondary | ICD-10-CM | POA: Insufficient documentation

## 2024-03-16 DIAGNOSIS — Z01818 Encounter for other preprocedural examination: Secondary | ICD-10-CM | POA: Diagnosis present

## 2024-03-16 DIAGNOSIS — R001 Bradycardia, unspecified: Secondary | ICD-10-CM | POA: Diagnosis not present

## 2024-03-16 LAB — CBC
HCT: 38.6 % (ref 36.0–46.0)
Hemoglobin: 13.2 g/dL (ref 12.0–15.0)
MCH: 31.6 pg (ref 26.0–34.0)
MCHC: 34.2 g/dL (ref 30.0–36.0)
MCV: 92.3 fL (ref 80.0–100.0)
Platelets: 223 K/uL (ref 150–400)
RBC: 4.18 MIL/uL (ref 3.87–5.11)
RDW: 13.9 % (ref 11.5–15.5)
WBC: 9.3 K/uL (ref 4.0–10.5)
nRBC: 0 % (ref 0.0–0.2)

## 2024-03-16 LAB — HEMOGLOBIN A1C
Hgb A1c MFr Bld: 5.8 % — ABNORMAL HIGH (ref 4.8–5.6)
Mean Plasma Glucose: 119.76 mg/dL

## 2024-03-16 LAB — BASIC METABOLIC PANEL WITH GFR
Anion gap: 11 (ref 5–15)
BUN: 14 mg/dL (ref 6–20)
CO2: 29 mmol/L (ref 22–32)
Calcium: 9.3 mg/dL (ref 8.9–10.3)
Chloride: 99 mmol/L (ref 98–111)
Creatinine, Ser: 1.04 mg/dL — ABNORMAL HIGH (ref 0.44–1.00)
GFR, Estimated: >60 mL/min (ref >60)
Glucose, Bld: 137 mg/dL — ABNORMAL HIGH (ref 70–99)
Potassium: 3.1 mmol/L — ABNORMAL LOW (ref 3.5–5.1)
Sodium: 139 mmol/L (ref 135–145)

## 2024-03-16 LAB — GLUCOSE, CAPILLARY: Glucose-Capillary: 153 mg/dL — ABNORMAL HIGH (ref 70–99)

## 2024-03-16 NOTE — Progress Notes (Signed)
 As of PAT appt, still awaiting second sign on orders.  PCP - Dr. Eligio Fairly Cardiologist - Denies  PPM/ICD - Denies Device Orders - n/a Rep Notified - n/a  Chest x-ray - n/a EKG - 03-16-24 Stress Test - denies ECHO - 10-01-16 Cardiac Cath - denies  Sleep Study - yes, positive CPAP - wears cpap nightly  Fasting Blood Sugar - unsure, patient does not check blood sugars Checks Blood Sugar - per patient has stuff do check blood sugar but does not  Last dose of GLP1 agonist- Semaglutide (Ozempic)  GLP1 instructions: Hold for 7 days, LD: 03/21/24  Blood Thinner Instructions: Denies Aspirin  Instructions:Per patient will stop 5 day's prior to surgery  ERAS Protcol - NPO PRE-SURGERY Ensure or G2- n/a  COVID TEST- n/a   Anesthesia review: Yes, HTN, DM, OSA w/cpap, Emphysema, COPD  Patient denies shortness of breath, fever, cough and chest pain at PAT appointment   All instructions explained to the patient, with a verbal understanding of the material. Patient agrees to go over the instructions while at home for a better understanding. Patient also instructed to self quarantine after being tested for COVID-19. The opportunity to ask questions was provided.

## 2024-03-16 NOTE — Progress Notes (Signed)
 Notified office of abnormal BMP

## 2024-03-29 ENCOUNTER — Ambulatory Visit (HOSPITAL_COMMUNITY): Admit: 2024-03-29 | Admitting: Pain Medicine

## 2024-03-29 SURGERY — INSERTION, SPINAL CORD STIMULATOR, LUMBAR
Anesthesia: General

## 2024-04-28 NOTE — Pre-Procedure Instructions (Signed)
 Surgical Instructions   Your procedure is scheduled on May 03, 2024. Report to [facility] Main Entrance A at 1:00 P.M., then check in with the Admitting office. Any questions or running late day of surgery: call [phone]  Questions prior to your surgery date: call [phone], Monday-Friday, 8am-4pm. If you experience any cold or flu symptoms such as cough, fever, chills, shortness of breath, etc. between now and your scheduled surgery, please notify us  at the above number.     Remember:  Do not eat after midnight the night before your surgery  You may drink clear liquids until 12:00 PM the afternoon of your surgery.   Clear liquids allowed are: Water , Non-Citrus Juices (without pulp), Carbonated Beverages, Clear Tea (no milk, honey, etc.), Black Coffee Only (NO MILK, CREAM OR POWDERED CREAMER of any kind), and Gatorade.    Take these medicines the morning of surgery with A SIP OF WATER : amLODipine (NORVASC)  DULoxetine (CYMBALTA)  levothyroxine  (SYNTHROID )  pantoprazole (PROTONIX)  rosuvastatin (CRESTOR)    May take these medicines IF NEEDED: ondansetron  (ZOFRAN -ODT)  traMADol (ULTRAM)  varenicline (CHANTIX)    Follow your surgeon's instructions on when to stop Aspirin .  If no instructions were given by your surgeon then you will need to call the office to get those instructions.     One week prior to surgery, STOP taking any Aleve, Naproxen, Ibuprofen , Motrin , Advil , Goody's, BC's, all herbal medications, fish oil, and non-prescription vitamins. This includes your medication: nabumetone (RELAFEN)    WHAT DO I DO ABOUT MY DIABETES MEDICATION?   STOP taking your Semaglutide [facility]) one week prior to surgery. DO NOT take any doses after December 2nd.   HOW TO MANAGE YOUR DIABETES BEFORE AND AFTER SURGERY  Why is it important to control my blood sugar before and after surgery? Improving blood sugar levels before and after surgery helps healing and can limit  problems. A way of improving blood sugar control is eating a healthy diet by:  Eating less sugar and carbohydrates  Increasing activity/exercise  Talking with your doctor about reaching your blood sugar goals High blood sugars (greater than 180 mg/dL) can raise your risk of infections and slow your recovery, so you will need to focus on controlling your diabetes during the weeks before surgery. Make sure that the doctor who takes care of your diabetes knows about your planned surgery including the date and location.  How do I manage my blood sugar before surgery? Check your blood sugar at least 4 times a day, starting 2 days before surgery, to make sure that the level is not too high or low.  Check your blood sugar the morning of your surgery when you wake up and every 2 hours until you get to the Short Stay unit.  If your blood sugar is less than 70 mg/dL, you will need to treat for low blood sugar: Do not take insulin . Treat a low blood sugar (less than 70 mg/dL) with  cup of clear juice (cranberry or apple), 4 glucose tablets, OR glucose gel. Recheck blood sugar in 15 minutes after treatment (to make sure it is greater than 70 mg/dL). If your blood sugar is not greater than 70 mg/dL on recheck, call [phone] for further instructions. Report your blood sugar to the short stay nurse when you get to Short Stay.  If you are admitted to the hospital after surgery: Your blood sugar will be checked by the staff and you will probably be given insulin  after surgery (instead  of oral diabetes medicines) to make sure you have good blood sugar levels. The goal for blood sugar control after surgery is 80-180 mg/dL.                      Do NOT Smoke (Tobacco/Vaping) for 24 hours prior to your procedure.  If you use a CPAP at night, you may bring your mask/headgear for your overnight stay.   You will be asked to remove any contacts, glasses, piercing's, hearing aid's, dentures/partials prior to  surgery. Please bring cases for these items if needed.    Patients discharged the day of surgery will not be allowed to drive home, and someone needs to stay with them for 24 hours.  SURGICAL WAITING ROOM VISITATION Patients may have no more than 2 support people in the waiting area - these visitors may rotate.   Pre-op nurse will coordinate an appropriate time for 1 ADULT support person, who may not rotate, to accompany patient in pre-op.  Children under the age of 31 must have an adult with them who is not the patient and must remain in the main waiting area with an adult.  If the patient needs to stay at the hospital during part of their recovery, the visitor guidelines for inpatient rooms apply.  Please refer to the [facility] website for the visitor guidelines for any additional information.   If you received a COVID test during your pre-op visit  it is requested that you wear a mask when out in public, stay away from anyone that may not be feeling well and notify your surgeon if you develop symptoms. If you have been in contact with anyone that has tested positive in the last 10 days please notify you surgeon.      Pre-operative 4 CHG Bathing Instructions   You can play a key role in reducing the risk of infection after surgery. Your skin needs to be as free of germs as possible. You can reduce the number of germs on your skin by washing with CHG (chlorhexidine  gluconate) soap before surgery. CHG is an antiseptic soap that kills germs and continues to kill germs even after washing.   DO NOT use if you have an allergy to chlorhexidine /CHG or antibacterial soaps. If your skin becomes reddened or irritated, stop using the CHG and notify one of our RNs at [phone].   Please shower with the CHG soap starting 4 days before surgery using the following schedule:     Please keep in mind the following:  DO NOT shave, including legs and underarms, starting the day of your first shower.    You may shave your face at any point before/day of surgery.  Place clean sheets on your bed the day you start using CHG soap. Use a clean washcloth (not used since being washed) for each shower. DO NOT sleep with pets once you start using the CHG.   CHG Shower Instructions:  Wash your face and private area with normal soap. If you choose to wash your hair, wash first with your normal shampoo.  After you use shampoo/soap, rinse your hair and body thoroughly to remove shampoo/soap residue.  Turn the water  OFF and apply  bottle of CHG soap to a CLEAN washcloth.  Apply CHG soap ONLY FROM YOUR NECK DOWN TO YOUR TOES (washing for 3-5 minutes)  DO NOT use CHG soap on face, private areas, open wounds, or sores.  Pay special attention to the area where your  surgery is being performed.  If you are having back surgery, having someone wash your back for you may be helpful. Wait 2 minutes after CHG soap is applied, then you may rinse off the CHG soap.  Pat dry with a clean towel  Put on clean clothes/pajamas   If you choose to wear lotion, please use ONLY the CHG-compatible lotions that are listed below.  Additional instructions for the day of surgery:  If you choose, you may shower the morning of surgery with an antibacterial soap.  DO NOT APPLY any lotions, deodorants, cologne, or perfumes.   Do not bring valuables to the hospital. [facility] is not responsible for any belongings/valuables. Do not wear nail polish, gel polish, artificial nails, or any other type of covering on natural nails (fingers and toes) Do not wear jewelry or makeup Put on clean/comfortable clothes.  Please brush your teeth.  Ask your nurse before applying any prescription medications to the skin.     CHG Compatible Lotions   Aveeno Moisturizing lotion  Cetaphil Moisturizing Cream  Cetaphil Moisturizing Lotion  Clairol Herbal Essence Moisturizing Lotion, Dry Skin  Clairol Herbal Essence Moisturizing Lotion, Extra  Dry Skin  Clairol Herbal Essence Moisturizing Lotion, Normal Skin  Curel Age Defying Therapeutic Moisturizing Lotion with Alpha Hydroxy  Curel Extreme Care Body Lotion  Curel Soothing Hands Moisturizing Hand Lotion  Curel Therapeutic Moisturizing Cream, Fragrance-Free  Curel Therapeutic Moisturizing Lotion, Fragrance-Free  Curel Therapeutic Moisturizing Lotion, Original Formula  Eucerin Daily Replenishing Lotion  Eucerin Dry Skin Therapy Plus Alpha Hydroxy Crme  Eucerin Dry Skin Therapy Plus Alpha Hydroxy Lotion  Eucerin Original Crme  Eucerin Original Lotion  Eucerin Plus Crme Eucerin Plus Lotion  Eucerin TriLipid Replenishing Lotion  Keri Anti-Bacterial Hand Lotion  Keri Deep Conditioning Original Lotion Dry Skin Formula Softly Scented  Keri Deep Conditioning Original Lotion, Fragrance Free Sensitive Skin Formula  Keri Lotion Fast Absorbing Fragrance Free Sensitive Skin Formula  Keri Lotion Fast Absorbing Softly Scented Dry Skin Formula  Keri Original Lotion  Keri Skin Renewal Lotion Keri Silky Smooth Lotion  Keri Silky Smooth Sensitive Skin Lotion  Nivea Body Creamy Conditioning Oil  Nivea Body Extra Enriched Lotion  Nivea Body Original Lotion  Nivea Body Sheer Moisturizing Lotion Nivea Crme  Nivea Skin Firming Lotion  NutraDerm 30 Skin Lotion  NutraDerm Skin Lotion  NutraDerm Therapeutic Skin Cream  NutraDerm Therapeutic Skin Lotion  ProShield Protective Hand Cream  Provon moisturizing lotion  Please read over the following fact sheets that you were given.

## 2024-05-01 ENCOUNTER — Other Ambulatory Visit: Payer: Self-pay

## 2024-05-01 ENCOUNTER — Encounter (HOSPITAL_COMMUNITY): Payer: Self-pay

## 2024-05-01 ENCOUNTER — Inpatient Hospital Stay (HOSPITAL_COMMUNITY): Admission: RE | Admit: 2024-05-01 | Discharge: 2024-05-01 | Attending: Pain Medicine | Admitting: Pain Medicine

## 2024-05-01 VITALS — BP 124/70 | HR 63 | Temp 98.1°F | Resp 18 | Ht 66.0 in | Wt 249.1 lb

## 2024-05-01 DIAGNOSIS — Z01818 Encounter for other preprocedural examination: Secondary | ICD-10-CM

## 2024-05-01 DIAGNOSIS — E119 Type 2 diabetes mellitus without complications: Secondary | ICD-10-CM

## 2024-05-01 LAB — CBC
HCT: 36.8 % (ref 36.0–46.0)
Hemoglobin: 12.6 g/dL (ref 12.0–15.0)
MCH: 32.4 pg (ref 26.0–34.0)
MCHC: 34.2 g/dL (ref 30.0–36.0)
MCV: 94.6 fL (ref 80.0–100.0)
Platelets: 229 K/uL (ref 150–400)
RBC: 3.89 MIL/uL (ref 3.87–5.11)
RDW: 14.2 % (ref 11.5–15.5)
WBC: 8.6 K/uL (ref 4.0–10.5)
nRBC: 0 % (ref 0.0–0.2)

## 2024-05-01 LAB — SURGICAL PCR SCREEN
MRSA, PCR: POSITIVE — AB
Staphylococcus aureus: POSITIVE — AB

## 2024-05-01 LAB — GLUCOSE, CAPILLARY: Glucose-Capillary: 97 mg/dL (ref 70–99)

## 2024-05-01 NOTE — Progress Notes (Addendum)
 Nasal PCR positive for MRSA. Surgeon's office notified via voicemail left for scheduler

## 2024-05-01 NOTE — Progress Notes (Signed)
 PCP - Dr Eligio Fairly Cardiologist - denies  PPM/ICD - denies   Chest x-ray - denies EKG - 03/16/24 Stress Test - denies ECHO - 10/01/16 Cardiac Cath - denies  Sleep Study - pt wears CPAP, unsure of settings   Fasting Blood Sugar - pt does not check CBG at home, pt states that she could check it, but would need to find her meter. CBG 97 today. Hgb A1c 6.2 on 04/05/24- lab work to be faxed from PCP  Last dose of GLP1 agonist-  Last dose 04/25/24, will not take again until after surgery   Blood Thinner Instructions:denies Aspirin  Instructions: last dose 04/28/24  ERAS Protcol - ERAS no drink   COVID TEST- n/A   Anesthesia review:  Patient denies shortness of breath, fever, cough and chest pain at PAT appointment. Pt denies cardiac symptoms or issues. Pt reports that her surgery was cancelled previously d/t her potassium being 3.1. Pt had potassium re-checked at PCP- records requested.    All instructions explained to the patient, with a verbal understanding of the material. Patient agrees to go over the instructions while at home for a better understanding.  The opportunity to ask questions was provided.

## 2024-05-02 NOTE — Progress Notes (Signed)
 Anesthesia Chart Review:   Case: [id number] Date/Time: 05/03/24 1445   Procedure: INSERTION, SPINAL CORD STIMULATOR, LUMBAR - SCS PERM WITH [employer]   Anesthesia type: General   Diagnosis: Radiculopathy, lumbar region [M54.16]   Pre-op diagnosis: Radiculopathy, lumbar region   [facility]: [facility] ROOM 18 / [facility]   Surgeons: Darlis Deatrice RAMAN, MD       DISCUSSION: Patient is a 58 year old female scheduled for the above procedure.  Surgery was initially scheduled for 03/29/2024, but Dr. Darlis cancelled due to hypokalemia with K 3.1. She has since had primary care follow-up with repeat BMP on 04/05/2024 showing K 3.6, Cr 1.09, BUN 15, Na 140, glucose 85, Ca 9.8. A1c then was 6.2%. She had a normal CBC on 05/01/2024.  History includes former smoker (quit 02/19/2024), COPD, OSA (uses CPAP), HTN, sinus bradycardia, lower extremity edema, aortic atherosclerosis, hypercholesterolemia, morbid obesity, DM2, hypothyroidism, GERD, osteoarthritis (left TKA 11/02/2016).  Last semaglutide 04/25/2024. Last ASA 04/28/2024.   Anesthesia team to evaluate on the day of surgery.    VS: BP 124/70   Pulse 63   Temp 36.7 C (Oral)   Resp 18   Ht 5' 6 (1.676 m)   Wt 113 kg   SpO2 96%   BMI 40.21 kg/m   PROVIDERS: Maree Isles, MD is PCP  - She is not followed routinely by cardiology but had a preoperative evaluation with TTE  by [facility] cardiologist Charls Bender, MD in 2018.   LABS: See DISCUSSION. 04/05/2024 labs scanned under Media tab. (all labs ordered are listed, but only abnormal results are displayed)  Labs Reviewed  SURGICAL PCR SCREEN - Abnormal; Notable for the following components:      Result Value   MRSA, PCR POSITIVE (*)    Staphylococcus aureus POSITIVE (*)    All other components within normal limits  GLUCOSE, CAPILLARY  CBC     IMAGES: MRI L-spine 09/09/2023: MPRESSION: - Degenerative changes of the lumbar spine as above. Disc bulge and facet arthrosis at L3-4  resulting in moderate spinal canal stenosis, similar to prior. Additional mild spinal canal stenosis at L4-5 is similar to prior. - Multilevel foraminal stenosis, greatest and moderate on the right at L3-4 and L4-5 which is increased from prior. - Similar annular fissure at L5-S1. - Facet arthrosis throughout the lumbar spine which may contribute to back pain. - Interval resolution of previously noted discogenic edema at L5-S1.  CT Chest LCS 07/29/2023: IMPRESSION: 1. Lung-RADS 2, benign appearance or behavior. Continue annual screening with low-dose chest CT without contrast in 12 months. 2. Aortic atherosclerosis (ICD10-I70.0) and emphysema (ICD10-J43.9). 3. Age advanced coronary artery atherosclerosis. Recommend assessment of coronary risk factors.     EKG: 03/16/2024: SB at 57 bpm   CV: Echo 10/01/2016: Impressions:  - Mild LVH with LVEF 65-70% and grade 1 diastolic dysfunction.    Trivial mitral regurgitation. Trivial tricuspid regurgitation.    Past Medical History:  Diagnosis Date   Anxiety    Aortic atherosclerosis    Back pain    Colitis    COPD (chronic obstructive pulmonary disease) (HCC)    Degenerative arthritis of lumbar spine    Depression    Diabetes mellitus without complication (HCC)    Emphysema, unspecified (HCC)    Frequent urination    Gallbladder disease    GERD (gastroesophageal reflux disease)    Hypercholesteremia    Hypertension    Hypothyroid    Hypothyroid    Joint pain    Lactose  intolerance    Morbid obesity (HCC)    Nerve pain    bilateral feet   Nocturia    OSA on CPAP    Osteoarthritis    Peripheral vasodilation    Sinus bradycardia    Sleep apnea    SOB (shortness of breath)    Swelling of both lower extremities    Urgency of urination    Vitamin D  deficiency    Wears glasses     Past Surgical History:  Procedure Laterality Date   BIOPSY  10/30/2021   Procedure: BIOPSY;  Surgeon: Cindie Carlin POUR, DO;  [facility]: [facility];  Service: Endoscopy;;   CESAREAN SECTION  1992   COLONOSCOPY     COLONOSCOPY WITH PROPOFOL  N/A 10/30/2021   Procedure: COLONOSCOPY WITH PROPOFOL ;  Surgeon: Cindie Carlin POUR, DO;  Location: [facility];  Service: Endoscopy;  Laterality: N/A;  2:30pm   CYSTOSCOPY WITH HYDRODISTENSION AND BIOPSY N/A 05/13/2017   Procedure: PHYLLIS JODI INSTILL MARCAINE  AND PYRIDIUM ;  Surgeon: Watt Rush, MD;  Location: [facility];  Service: Urology;  Laterality: N/A;   ESOPHAGOGASTRODUODENOSCOPY (EGD) WITH PROPOFOL  N/A 10/30/2021   Procedure: ESOPHAGOGASTRODUODENOSCOPY (EGD) WITH PROPOFOL ;  Surgeon: Cindie Carlin POUR, DO;  [facility];  Service: Endoscopy;  Laterality: N/A;   LAPAROSCOPIC CHOLECYSTECTOMY  1992   POLYPECTOMY  10/30/2021   Procedure: POLYPECTOMY;  Surgeon: Cindie Carlin POUR, DO;  [facility];  Service: Endoscopy;;   TOTAL KNEE ARTHROPLASTY Left 11/02/2016   Procedure: TOTAL KNEE ARTHROPLASTY;  Surgeon: Jane Charleston, MD;  Location: [facility] OR;  Service: Orthopedics;  Laterality: Left;   TRANSTHORACIC ECHOCARDIOGRAM  10/01/2016   ef 65-70%, grade 1 diastolic dysfunction/  trivial MR and TR   TUBAL LIGATION Bilateral 1995    MEDICATIONS:  amitriptyline (ELAVIL) 10 MG tablet   amLODipine (NORVASC) 5 MG tablet   aspirin  EC 81 MG tablet   Cholecalciferol  (VITAMIN D3) 2000 units TABS   Cyanocobalamin  (VITAMIN B-12 PO)   DULoxetine (CYMBALTA) 60 MG capsule   folic acid (FOLATE) 400 MCG tablet   hydrochlorothiazide (HYDRODIURIL) 25 MG tablet   levothyroxine  (SYNTHROID ) 125 MCG tablet   losartan (COZAAR) 100 MG tablet   nabumetone (RELAFEN) 750 MG tablet   ondansetron  (ZOFRAN -ODT) 4 MG disintegrating tablet   pantoprazole (PROTONIX) 40 MG tablet   rosuvastatin (CRESTOR) 5 MG tablet   Semaglutide, 2 MG/DOSE, (OZEMPIC, 2 MG/DOSE,) 8 MG/3ML SOPN   tiZANidine (ZANAFLEX) 4 MG tablet   traMADol (ULTRAM) 50 MG tablet   varenicline (CHANTIX) 1 MG  tablet   Vitamin D , Ergocalciferol , (DRISDOL ) 1.25 MG (50000 UNIT) CAPS capsule   No current facility-administered medications for this encounter.    Isaiah Ruder, PA-C Surgical Short Stay/Anesthesiology [facility] Phone [phone] [facility] Phone [phone] 05/02/2024 11:53 AM

## 2024-05-02 NOTE — Anesthesia Preprocedure Evaluation (Signed)
 Anesthesia Evaluation    Airway        Dental   Pulmonary former smoker          Cardiovascular hypertension,      Neuro/Psych    GI/Hepatic   Endo/Other  diabetes    Renal/GU      Musculoskeletal   Abdominal   Peds  Hematology   Anesthesia Other Findings   Reproductive/Obstetrics                              Anesthesia Physical Anesthesia Plan  ASA:   Anesthesia Plan:    Post-op Pain Management:    Induction:   PONV Risk Score and Plan:   Airway Management Planned:   Additional Equipment:   Intra-op Plan:   Post-operative Plan:   Informed Consent:   Plan Discussed with:   Anesthesia Plan Comments: (PAT note written 05/02/2024 by Allison Zelenak, PA-C.  )        Anesthesia Quick Evaluation

## 2024-05-03 ENCOUNTER — Encounter: Payer: Self-pay | Admitting: Pain Medicine

## 2024-05-03 ENCOUNTER — Ambulatory Visit: Payer: Self-pay | Admitting: Pain Medicine

## 2024-05-03 ENCOUNTER — Ambulatory Visit (HOSPITAL_COMMUNITY): Admitting: Anesthesiology

## 2024-05-03 ENCOUNTER — Other Ambulatory Visit: Payer: Self-pay | Admitting: Pain Medicine

## 2024-05-03 ENCOUNTER — Encounter (HOSPITAL_COMMUNITY): Admission: RE | Disposition: A | Payer: Self-pay | Source: Home / Self Care | Attending: Pain Medicine

## 2024-05-03 ENCOUNTER — Encounter (HOSPITAL_COMMUNITY): Payer: Self-pay | Admitting: Pain Medicine

## 2024-05-03 ENCOUNTER — Other Ambulatory Visit: Payer: Self-pay

## 2024-05-03 ENCOUNTER — Ambulatory Visit (HOSPITAL_COMMUNITY)

## 2024-05-03 ENCOUNTER — Ambulatory Visit (HOSPITAL_COMMUNITY): Admitting: Vascular Surgery

## 2024-05-03 ENCOUNTER — Ambulatory Visit (HOSPITAL_COMMUNITY)
Admission: RE | Admit: 2024-05-03 | Discharge: 2024-05-03 | Disposition: A | Attending: Pain Medicine | Admitting: Pain Medicine

## 2024-05-03 DIAGNOSIS — F32A Depression, unspecified: Secondary | ICD-10-CM | POA: Diagnosis not present

## 2024-05-03 DIAGNOSIS — Z6838 Body mass index (BMI) 38.0-38.9, adult: Secondary | ICD-10-CM | POA: Diagnosis not present

## 2024-05-03 DIAGNOSIS — E039 Hypothyroidism, unspecified: Secondary | ICD-10-CM | POA: Diagnosis not present

## 2024-05-03 DIAGNOSIS — E66813 Obesity, class 3: Secondary | ICD-10-CM | POA: Diagnosis not present

## 2024-05-03 DIAGNOSIS — K219 Gastro-esophageal reflux disease without esophagitis: Secondary | ICD-10-CM | POA: Diagnosis not present

## 2024-05-03 DIAGNOSIS — I10 Essential (primary) hypertension: Secondary | ICD-10-CM | POA: Diagnosis not present

## 2024-05-03 DIAGNOSIS — Z87891 Personal history of nicotine dependence: Secondary | ICD-10-CM | POA: Diagnosis not present

## 2024-05-03 DIAGNOSIS — E119 Type 2 diabetes mellitus without complications: Secondary | ICD-10-CM

## 2024-05-03 DIAGNOSIS — Z7985 Long-term (current) use of injectable non-insulin antidiabetic drugs: Secondary | ICD-10-CM | POA: Diagnosis not present

## 2024-05-03 DIAGNOSIS — M5416 Radiculopathy, lumbar region: Secondary | ICD-10-CM | POA: Diagnosis present

## 2024-05-03 DIAGNOSIS — G4730 Sleep apnea, unspecified: Secondary | ICD-10-CM | POA: Diagnosis not present

## 2024-05-03 DIAGNOSIS — Z79899 Other long term (current) drug therapy: Secondary | ICD-10-CM | POA: Diagnosis not present

## 2024-05-03 DIAGNOSIS — J449 Chronic obstructive pulmonary disease, unspecified: Secondary | ICD-10-CM | POA: Diagnosis not present

## 2024-05-03 DIAGNOSIS — G894 Chronic pain syndrome: Secondary | ICD-10-CM | POA: Diagnosis not present

## 2024-05-03 DIAGNOSIS — M961 Postlaminectomy syndrome, not elsewhere classified: Secondary | ICD-10-CM | POA: Diagnosis not present

## 2024-05-03 DIAGNOSIS — F419 Anxiety disorder, unspecified: Secondary | ICD-10-CM | POA: Diagnosis not present

## 2024-05-03 LAB — GLUCOSE, CAPILLARY
Glucose-Capillary: 103 mg/dL — ABNORMAL HIGH (ref 70–99)
Glucose-Capillary: 81 mg/dL (ref 70–99)
Glucose-Capillary: 137 mg/dL — ABNORMAL HIGH (ref 70–99)

## 2024-05-03 SURGERY — INSERTION, SPINAL CORD STIMULATOR, LUMBAR
Anesthesia: General | Site: Back

## 2024-05-03 MED ORDER — OXYCODONE HCL 5 MG/5ML PO SOLN
5.0000 mg | Freq: Once | ORAL | Status: AC | PRN
  Administered 2024-05-03: 5 mg via ORAL

## 2024-05-03 MED ORDER — FENTANYL CITRATE (PF) 100 MCG/2ML IJ SOLN
INTRAMUSCULAR | Status: AC
  Filled 2024-05-03: qty 2

## 2024-05-03 MED ORDER — LACTATED RINGERS IV SOLN: INTRAVENOUS | Status: DC

## 2024-05-03 MED ORDER — DROPERIDOL 2.5 MG/ML IJ SOLN
INTRAMUSCULAR | Status: AC
  Filled 2024-05-03: qty 2

## 2024-05-03 MED ORDER — BUPIVACAINE HCL (PF) 0.25 % IJ SOLN
INTRAMUSCULAR | Status: AC
  Filled 2024-05-03: qty 30

## 2024-05-03 MED ORDER — ROCURONIUM BROMIDE 10 MG/ML (PF) SYRINGE
PREFILLED_SYRINGE | INTRAVENOUS | Status: DC | PRN
  Administered 2024-05-03: 50 mg via INTRAVENOUS

## 2024-05-03 MED ORDER — ONDANSETRON HCL 4 MG/2ML IJ SOLN: 4.0000 mg | Freq: Four times a day (QID) | INTRAMUSCULAR | Status: DC | PRN

## 2024-05-03 MED ORDER — ORAL CARE MOUTH RINSE: 15.0000 mL | Freq: Once | OROMUCOSAL | Status: DC

## 2024-05-03 MED ORDER — BACITRACIN-NEOMYCIN-POLYMYXIN OINTMENT TUBE
TOPICAL_OINTMENT | CUTANEOUS | Status: AC
  Filled 2024-05-03: qty 14.17

## 2024-05-03 MED ORDER — MIDAZOLAM HCL (PF) 2 MG/2ML IJ SOLN
INTRAMUSCULAR | Status: DC | PRN
  Administered 2024-05-03: 2 mg via INTRAVENOUS

## 2024-05-03 MED ORDER — PROPOFOL 10 MG/ML IV BOLUS
INTRAVENOUS | Status: AC
  Filled 2024-05-03: qty 20

## 2024-05-03 MED ORDER — BACITRACIN-NEOMYCIN-POLYMYXIN OINTMENT TUBE
TOPICAL_OINTMENT | CUTANEOUS | Status: DC | PRN
  Administered 2024-05-03: 1 via TOPICAL

## 2024-05-03 MED ORDER — MIDAZOLAM HCL 2 MG/2ML IJ SOLN
INTRAMUSCULAR | Status: AC
  Filled 2024-05-03: qty 2

## 2024-05-03 MED ORDER — ORAL CARE MOUTH RINSE: 15.0000 mL | Freq: Once | OROMUCOSAL | Status: AC

## 2024-05-03 MED ORDER — FENTANYL CITRATE (PF) 100 MCG/2ML IJ SOLN
25.0000 ug | INTRAMUSCULAR | Status: DC | PRN
  Administered 2024-05-03 (×2): 50 ug via INTRAVENOUS
  Administered 2024-05-03: 25 ug via INTRAVENOUS

## 2024-05-03 MED ORDER — ONDANSETRON HCL 4 MG/2ML IJ SOLN
INTRAMUSCULAR | Status: DC | PRN
  Administered 2024-05-03: 4 mg via INTRAVENOUS

## 2024-05-03 MED ORDER — DEXAMETHASONE SOD PHOSPHATE PF 10 MG/ML IJ SOLN
INTRAMUSCULAR | Status: DC | PRN
  Administered 2024-05-03: 10 mg via INTRAVENOUS

## 2024-05-03 MED ORDER — CEFAZOLIN SODIUM-DEXTROSE 2-3 GM-%(50ML) IV SOLR
INTRAVENOUS | Status: DC | PRN
  Administered 2024-05-03: 2 g via INTRAVENOUS

## 2024-05-03 MED ORDER — BUPIVACAINE HCL 0.25 % IJ SOLN
INTRAMUSCULAR | Status: DC | PRN
  Administered 2024-05-03: 10 mL

## 2024-05-03 MED ORDER — FENTANYL CITRATE (PF) 250 MCG/5ML IJ SOLN
INTRAMUSCULAR | Status: DC | PRN
  Administered 2024-05-03 (×3): 50 ug via INTRAVENOUS

## 2024-05-03 MED ORDER — VANCOMYCIN HCL 1500 MG/300ML IV SOLN
1500.0000 mg | Freq: Once | INTRAVENOUS | Status: AC
  Administered 2024-05-03: 1500 mg via INTRAVENOUS
  Filled 2024-05-03: qty 300

## 2024-05-03 MED ORDER — CHLORHEXIDINE GLUCONATE 0.12 % MT SOLN
15.0000 mL | Freq: Once | OROMUCOSAL | Status: AC
  Administered 2024-05-03: 15 mL via OROMUCOSAL

## 2024-05-03 MED ORDER — DROPERIDOL 2.5 MG/ML IJ SOLN
0.6250 mg | Freq: Once | INTRAMUSCULAR | Status: AC
  Administered 2024-05-03: 0.625 mg via INTRAVENOUS

## 2024-05-03 MED ORDER — OXYCODONE HCL 5 MG/5ML PO SOLN
ORAL | Status: AC
  Filled 2024-05-03: qty 5

## 2024-05-03 MED ORDER — CEFAZOLIN SODIUM-DEXTROSE 2-4 GM/100ML-% IV SOLN
INTRAVENOUS | Status: AC
  Filled 2024-05-03: qty 100

## 2024-05-03 MED ORDER — CHLORHEXIDINE GLUCONATE 0.12 % MT SOLN
15.0000 mL | Freq: Once | OROMUCOSAL | Status: DC
  Filled 2024-05-03: qty 15

## 2024-05-03 MED ORDER — INSULIN ASPART 100 UNIT/ML IJ SOLN: 0.0000 [IU] | INTRAMUSCULAR | Status: DC | PRN

## 2024-05-03 MED ORDER — PROPOFOL 10 MG/ML IV BOLUS
INTRAVENOUS | Status: DC | PRN
  Administered 2024-05-03: 100 mg via INTRAVENOUS

## 2024-05-03 MED ORDER — ACETAMINOPHEN 500 MG PO TABS
1000.0000 mg | ORAL_TABLET | Freq: Once | ORAL | Status: AC
  Administered 2024-05-03: 1000 mg via ORAL
  Filled 2024-05-03: qty 2

## 2024-05-03 MED ORDER — OXYCODONE HCL 5 MG PO TABS: 5.0000 mg | ORAL_TABLET | Freq: Once | ORAL | Status: AC | PRN

## 2024-05-03 MED ORDER — PHENYLEPHRINE 80 MCG/ML (10ML) SYRINGE FOR IV PUSH (FOR BLOOD PRESSURE SUPPORT)
PREFILLED_SYRINGE | INTRAVENOUS | Status: DC | PRN
  Administered 2024-05-03: 160 ug via INTRAVENOUS

## 2024-05-03 MED ORDER — 0.9 % SODIUM CHLORIDE (POUR BTL) OPTIME
TOPICAL | Status: DC | PRN
  Administered 2024-05-03: 1000 mL

## 2024-05-03 MED ORDER — SUGAMMADEX SODIUM 200 MG/2ML IV SOLN
INTRAVENOUS | Status: DC | PRN
  Administered 2024-05-03: 200 mg via INTRAVENOUS

## 2024-05-03 MED ORDER — LIDOCAINE 2% (20 MG/ML) 5 ML SYRINGE
INTRAMUSCULAR | Status: DC | PRN
  Administered 2024-05-03: 60 mg via INTRAVENOUS

## 2024-05-03 SURGICAL SUPPLY — 57 items
ANCHOR CLIK NEURO F/LEAD 2 (Anchor) IMPLANT
BAG COUNTER SPONGE SURGICOUNT (BAG) ×1 IMPLANT
BAG DECANTER FOR FLEXI CONT (MISCELLANEOUS) ×1 IMPLANT
BENZOIN TINCTURE PRP APPL 2/3 (GAUZE/BANDAGES/DRESSINGS) IMPLANT
BINDER ABDOMINAL 12 ML 46-62 (SOFTGOODS) ×1 IMPLANT
BLADE CLIPPER SURG (BLADE) IMPLANT
CHLORAPREP W/TINT 26 (MISCELLANEOUS) ×1 IMPLANT
CLEANER TIP ELECTROSURG 2X2 (MISCELLANEOUS) ×1 IMPLANT
CLIP TI WIDE RED SMALL 6 (CLIP) IMPLANT
CONTROL REMOTE FREELINK ALPHA (NEUROSURGERY SUPPLIES) IMPLANT
DERMABOND ADVANCED .7 DNX12 (GAUZE/BANDAGES/DRESSINGS) ×1 IMPLANT
DEVICE FIXATE SUTURING DUAL (MISCELLANEOUS) IMPLANT
DEVICE FIXATE SUTURING SINGLE (NEUROSURGERY SUPPLIES) IMPLANT
DRAPE C-ARM 42X72 X-RAY (DRAPES) ×1 IMPLANT
DRAPE C-ARMOR (DRAPES) ×1 IMPLANT
DRAPE LAPAROTOMY 100X72X124 (DRAPES) ×1 IMPLANT
DRAPE SURG 17X23 STRL (DRAPES) ×4 IMPLANT
DRSG AQUACEL AG 3.5X4 (GAUZE/BANDAGES/DRESSINGS) IMPLANT
DRSG AQUACEL AG ADV 3.5X 4 (GAUZE/BANDAGES/DRESSINGS) IMPLANT
DRSG OPSITE POSTOP 3X4 (GAUZE/BANDAGES/DRESSINGS) IMPLANT
ELECTRODE REM PT RTRN 9FT ADLT (ELECTROSURGICAL) ×1 IMPLANT
GAUZE 4X4 16PLY ~~LOC~~+RFID DBL (SPONGE) ×1 IMPLANT
GLOVE BIO SURGEON STRL SZ8 (GLOVE) ×1 IMPLANT
GLOVE BIOGEL PI IND STRL 8.5 (GLOVE) ×1 IMPLANT
GLOVE EXAM NITRILE XS STR PU (GLOVE) IMPLANT
GLOVE SRG 8 PF TXTR STRL LF DI (GLOVE) ×1 IMPLANT
GOWN STRL REUS W/ TWL LRG LVL3 (GOWN DISPOSABLE) IMPLANT
GOWN STRL REUS W/ TWL XL LVL3 (GOWN DISPOSABLE) IMPLANT
GOWN STRL REUS W/TWL 2XL LVL3 (GOWN DISPOSABLE) IMPLANT
KIT BASIN OR (CUSTOM PROCEDURE TRAY) ×1 IMPLANT
KIT CHARGING PRECISION NEURO (KITS) IMPLANT
KIT IPG ALPHA WAVEWRITER (Stimulator) IMPLANT
KIT LEAD 50CM (Lead) IMPLANT
KIT TURNOVER KIT B (KITS) ×1 IMPLANT
NDL 18GX1X1/2 (RX/OR ONLY) (NEEDLE) IMPLANT
NDL HYPO 25X1 1.5 SAFETY (NEEDLE) ×1 IMPLANT
PACK LAMINECTOMY NEURO (CUSTOM PROCEDURE TRAY) ×1 IMPLANT
PAD ARMBOARD POSITIONER FOAM (MISCELLANEOUS) ×1 IMPLANT
POUCH TYRX ANTIBAC NEURO MED (Mesh General) IMPLANT
SOLN 0.9% NACL POUR BTL 1000ML (IV SOLUTION) ×1 IMPLANT
SOLN STERILE WATER BTL 1000 ML (IV SOLUTION) ×1 IMPLANT
SPONGE SURGIFOAM ABS GEL SZ50 (HEMOSTASIS) IMPLANT
SPONGE T-LAP 4X18 ~~LOC~~+RFID (SPONGE) ×1 IMPLANT
STAPLER SKIN PROX WIDE 3.9 (STAPLE) ×1 IMPLANT
STRIP CLOSURE SKIN 1/2X4 (GAUZE/BANDAGES/DRESSINGS) IMPLANT
SUT MNCRL AB 4-0 PS2 18 (SUTURE) IMPLANT
SUT SILK 0 MO-6 18XCR BRD 8 (SUTURE) IMPLANT
SUT SILK 0 TIES 10X30 (SUTURE) IMPLANT
SUT SILK 2 0 TIES 10X30 (SUTURE) IMPLANT
SUT VIC AB 2-0 CP2 18 (SUTURE) ×2 IMPLANT
SYR 10ML LL (SYRINGE) IMPLANT
SYR EPIDURAL 5ML GLASS (SYRINGE) ×1 IMPLANT
TOOL LONG TUNNEL (SPINAL CORD STIMULATOR) IMPLANT
TOWEL GREEN STERILE (TOWEL DISPOSABLE) ×1 IMPLANT
TOWEL GREEN STERILE FF (TOWEL DISPOSABLE) ×1 IMPLANT
WRENCH HEX 7.6 (SPINAL CORD STIMULATOR) IMPLANT
YANKAUER SUCT BULB TIP NO VENT (SUCTIONS) ×1 IMPLANT

## 2024-05-03 NOTE — H&P (Signed)
°  The note originally documented on this encounter has been moved the the encounter in which it belongs.

## 2024-05-03 NOTE — H&P (Deleted)
   The note originally documented on this encounter has been moved the the encounter in which it belongs.

## 2024-05-03 NOTE — H&P (Signed)
°  Chief complaint my back hurts   History of present illness pleasant patient with history of lumbar spine surgery, lumbar radiculopathy.  She has undergone conservative care including injections and medication management without any sustained benefit.  She subsequently underwent a spinal cord stimulator trial, she received well over 50% benefit from the trial.  She presents today for permanent implantation.    Past medical history  Diabetes mellitus COPD  Depression  Reflux  Morbid obesity  Surgical history  Lumbar spine surgery  Laparoscopic cholecystectomy  Total knee replacement on the left   Allergies Morphine   Medications  Elavil  Amlodipine Levothyroxine   Losartan Nabumetone  Rosuvastatin  Trulicity  did  Review of systems  Review of systems h performed and negative except as mentioned above  Laboratory results  Laboratory results reviewed.  She is noted to be MRSA positive.  She will be given appropriate preoperative antibiotics which include vancomycin  with cefazolin , she received an antibiotic pouch for her stimulator  She received doxycycline postoperatively.  Risk of having the surgery performed with MRSA were discussed with the patient.  The patient would like to proceed.  Physical examination Alert  No strength deficits in LE bilaterally Soft abdomen Regular breathing Regular pulse No rashes over surgical sites   Assessment plan  58 year old female with history of lumbar spine surgery, lumbar radiculopathy, successful spinal cord stimulator trial   As mentioned above, she does have a positive MRSA nasal swab.  She will be treated with appropriate prophylactic precautions.  She will be provided with postoperative antibiotics.  She would like to undergo the implant today.  She is where the risk of a MRSA infection including sepsis, and the need to have the device removed.  She expressed understanding.

## 2024-05-03 NOTE — Anesthesia Procedure Notes (Signed)
 Procedure Name: Intubation Date/Time: 05/03/2024 3:54 PM  Performed by: Delores Duwaine SAUNDERS, CRNAPre-anesthesia Checklist: Patient identified, Emergency Drugs available, Suction available and Patient being monitored Patient Re-evaluated:Patient Re-evaluated prior to induction Oxygen Delivery Method: Circle System Utilized Preoxygenation: Pre-oxygenation with 100% oxygen Induction Type: IV induction Ventilation: Mask ventilation without difficulty Laryngoscope Size: Mac and 3 Grade View: Grade I Tube type: Oral Number of attempts: 1 Airway Equipment and Method: Stylet and Oral airway Placement Confirmation: ETT inserted through vocal cords under direct vision, positive ETCO2 and breath sounds checked- equal and bilateral Secured at: 21 cm Tube secured with: Tape Dental Injury: Teeth and Oropharynx as per pre-operative assessment

## 2024-05-03 NOTE — H&P (Signed)
°  Cc: My back hurts  HPI:  Here for permanent SCS implant, over 50% benefit from trial.   Med, allergies, PMH all reviewed in chart and agree.  MRSA+ noted.  Full review of systems performed.  PE:  Alert No motor deficits noted in bilateral LE. 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 Dressing change 6-9 days. She will contact me for any questions or concerns.

## 2024-05-03 NOTE — Op Note (Signed)
 Preoperative Diagnosis: Failed back surgical syndrome Lumbar radiculopathy Chronic Pain Syndrome  Postoperative Diagnosis: Same  Procedures Performed:  Permanent Implantation of (2) percutaneous spinal cord stimulator leads Permanent Implantation of Spinal Cord Stimulator Battery  Surgeon: Deatrice RAMAN. Darlis, MD  Procedure Note in Detail:  Patient met in holding area.  Questions answered, consent obtained.  Patient taken to OR and placed under care of anesthesia team.  Placed prone with all pressure points padded.  Sterile prep and drape in usual fashion.  Time out performed.  Antibiotic mixture of vancomycin  and cefazolin  given.  Under flouroscopy, T12-L1 interspace identified.  Overlying skin anesthetized with 0.25% bupivicaine (see nursing record for amount).  Incision made and dissection carried to superspious ligament.  Using intermittent flouroscopy combined with a loss of resistance to air technique, the epidural space was easily accessed.  An 8 contact [employer] lead was threaded to so the 3 most distal contacts were in the inferior portion of the T7 vertebral body, just to the left of midline.  AP and lateral imaging noted good placement.  A second lead was placed in an identical fashion, just to the right of midline.  Next, each lead was secured using click anchors and a fixate device to anchor to ligament.  Final imaging noted good lead position.  Next, attention was directed to the left flank.  A 2 inch linear region was anesthetized with 0.25% bupivicaine ( see nursing record for amount).  An incision was made and a pocket created.  The leads were then tunneled to the pocket using a tunneling device.  The leads were then secured to the battery.  All parameters were deemed appropriate by our [employer].  Leads were secured to battery with hex wrench, all parameters still appropriate.  Each pocket irrigated; hemostasis achieved.  The battery was placed in a  medtronic tyrx antibiotic pouch.  Each pocket closed with series of 2-0 vicryls followed by staples, sterile dressing.  Complications: None  Fluids: Per anesthesia  Implants: [employer] 8 contact lead X 2 [employer] Battery Fixate Device Click Anchors Medtronic tyrx antibiotic pouch  Disposition Pacu, then home with family.  Patient will follow up in clinic in 9 days for dressing and staple removal.

## 2024-05-03 NOTE — Transfer of Care (Signed)
 Immediate Anesthesia Transfer of Care Note  Patient: Christy Randall  Procedure(s) Performed: Permanent Spinal Cord Stimulator and Battery Implant (Back)  Patient Location: PACU  Anesthesia Type:General  Level of Consciousness: drowsy and patient cooperative  Airway & Oxygen Therapy: Patient Spontanous Breathing and Patient connected to face mask oxygen  Post-op Assessment: Report given to RN and Post -op Vital signs reviewed and stable  Post vital signs: Reviewed and stable  Last Vitals:  Vitals Value Taken Time  BP 158/86 05/03/24 17:35  Temp    Pulse 70 05/03/24 17:37  Resp 20 05/03/24 17:37  SpO2 96 % 05/03/24 17:37  Vitals shown include unfiled device data.  Last Pain:  Vitals:   05/03/24 1412  PainSc: 5       Patients Stated Pain Goal: 4 (05/03/24 1412)  Complications: No notable events documented.

## 2024-05-04 NOTE — Anesthesia Postprocedure Evaluation (Signed)
 Anesthesia Post Note  Patient: Christy Randall  Procedure(s) Performed: Permanent Spinal Cord Stimulator and Battery Implant (Back)     Patient location during evaluation: PACU Anesthesia Type: General Level of consciousness: awake and alert Pain management: pain level controlled Vital Signs Assessment: post-procedure vital signs reviewed and stable Respiratory status: spontaneous breathing, nonlabored ventilation and respiratory function stable Cardiovascular status: blood pressure returned to baseline and stable Postop Assessment: no apparent nausea or vomiting Anesthetic complications: no   No notable events documented.  Last Vitals:  Vitals:   05/03/24 1815 05/03/24 1830  BP: 109/62 128/74  Pulse: 66 61  Resp: 13 13  Temp: 36.5 C   SpO2: 92% 91%    Last Pain:  Vitals:   05/03/24 1800  PainSc: 5                  Oluwatobi Visser,W. EDMOND

## 2024-05-10 ENCOUNTER — Encounter (HOSPITAL_COMMUNITY): Payer: Self-pay | Admitting: Pain Medicine

## 2024-06-02 ENCOUNTER — Other Ambulatory Visit: Payer: Self-pay

## 2024-06-02 DIAGNOSIS — Z122 Encounter for screening for malignant neoplasm of respiratory organs: Secondary | ICD-10-CM

## 2024-06-02 DIAGNOSIS — F17210 Nicotine dependence, cigarettes, uncomplicated: Secondary | ICD-10-CM

## 2024-06-02 DIAGNOSIS — Z87891 Personal history of nicotine dependence: Secondary | ICD-10-CM
